# Patient Record
Sex: Female | Born: 1945
Health system: Southern US, Community
[De-identification: ages and names within clinical notes are randomized; demographics above are authoritative.]

## PROBLEM LIST (undated history)

## (undated) DIAGNOSIS — C801 Malignant (primary) neoplasm, unspecified: Secondary | ICD-10-CM

## (undated) DIAGNOSIS — T7840XA Allergy, unspecified, initial encounter: Secondary | ICD-10-CM

## (undated) DIAGNOSIS — H269 Unspecified cataract: Secondary | ICD-10-CM

## (undated) DIAGNOSIS — D126 Benign neoplasm of colon, unspecified: Secondary | ICD-10-CM

## (undated) DIAGNOSIS — I10 Essential (primary) hypertension: Secondary | ICD-10-CM

## (undated) DIAGNOSIS — J302 Other seasonal allergic rhinitis: Secondary | ICD-10-CM

## (undated) DIAGNOSIS — G4730 Sleep apnea, unspecified: Secondary | ICD-10-CM

## (undated) DIAGNOSIS — M1990 Unspecified osteoarthritis, unspecified site: Secondary | ICD-10-CM

## (undated) DIAGNOSIS — E785 Hyperlipidemia, unspecified: Secondary | ICD-10-CM

## (undated) DIAGNOSIS — M8580 Other specified disorders of bone density and structure, unspecified site: Secondary | ICD-10-CM

## (undated) HISTORY — PX: COLONOSCOPY: SHX174

## (undated) HISTORY — DX: Sleep apnea, unspecified: G47.30

## (undated) HISTORY — DX: Unspecified cataract: H26.9

## (undated) HISTORY — DX: Hyperlipidemia, unspecified: E78.5

## (undated) HISTORY — PX: DILATION AND CURETTAGE OF UTERUS: SHX78

## (undated) HISTORY — DX: Malignant (primary) neoplasm, unspecified: C80.1

## (undated) HISTORY — PX: KNEE ARTHROSCOPY: SUR90

## (undated) HISTORY — DX: Other specified disorders of bone density and structure, unspecified site: M85.80

## (undated) HISTORY — DX: Allergy, unspecified, initial encounter: T78.40XA

## (undated) HISTORY — DX: Other seasonal allergic rhinitis: J30.2

## (undated) HISTORY — DX: Unspecified osteoarthritis, unspecified site: M19.90

## (undated) HISTORY — DX: Essential (primary) hypertension: I10

## (undated) HISTORY — DX: Benign neoplasm of colon, unspecified: D12.6

---
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
## 1979-07-22 HISTORY — PX: CHOLECYSTECTOMY: SHX55

## 1992-07-21 HISTORY — PX: VAGINAL HYSTERECTOMY: SUR661

## 1995-07-22 DIAGNOSIS — C801 Malignant (primary) neoplasm, unspecified: Secondary | ICD-10-CM

## 1995-07-22 HISTORY — DX: Malignant (primary) neoplasm, unspecified: C80.1

## 2004-05-17 ENCOUNTER — Emergency Department (HOSPITAL_COMMUNITY): Admission: EM | Admit: 2004-05-17 | Discharge: 2004-05-17 | Payer: Self-pay | Admitting: Family Medicine

## 2006-10-20 ENCOUNTER — Ambulatory Visit: Payer: Self-pay | Admitting: Internal Medicine

## 2006-11-09 ENCOUNTER — Ambulatory Visit: Payer: Self-pay | Admitting: Internal Medicine

## 2006-11-09 ENCOUNTER — Encounter (INDEPENDENT_AMBULATORY_CARE_PROVIDER_SITE_OTHER): Payer: Self-pay | Admitting: Specialist

## 2006-12-07 ENCOUNTER — Ambulatory Visit: Payer: Self-pay | Admitting: Internal Medicine

## 2007-07-22 HISTORY — PX: FOOT SURGERY: SHX648

## 2007-11-14 ENCOUNTER — Ambulatory Visit: Payer: Self-pay | Admitting: Vascular Surgery

## 2007-11-14 ENCOUNTER — Encounter (INDEPENDENT_AMBULATORY_CARE_PROVIDER_SITE_OTHER): Payer: Self-pay | Admitting: Emergency Medicine

## 2007-11-14 ENCOUNTER — Emergency Department (HOSPITAL_COMMUNITY): Admission: EM | Admit: 2007-11-14 | Discharge: 2007-11-14 | Payer: Self-pay | Admitting: Family Medicine

## 2008-01-19 HISTORY — PX: OTHER SURGICAL HISTORY: SHX169

## 2009-01-01 ENCOUNTER — Telehealth: Payer: Self-pay | Admitting: Internal Medicine

## 2009-01-02 DIAGNOSIS — K7689 Other specified diseases of liver: Secondary | ICD-10-CM | POA: Insufficient documentation

## 2009-01-02 DIAGNOSIS — Z860100 Personal history of colon polyps, unspecified: Secondary | ICD-10-CM | POA: Insufficient documentation

## 2009-01-02 DIAGNOSIS — K648 Other hemorrhoids: Secondary | ICD-10-CM | POA: Insufficient documentation

## 2009-01-02 DIAGNOSIS — Z85828 Personal history of other malignant neoplasm of skin: Secondary | ICD-10-CM

## 2009-01-02 DIAGNOSIS — K5730 Diverticulosis of large intestine without perforation or abscess without bleeding: Secondary | ICD-10-CM | POA: Insufficient documentation

## 2009-01-02 DIAGNOSIS — M810 Age-related osteoporosis without current pathological fracture: Secondary | ICD-10-CM | POA: Insufficient documentation

## 2009-01-02 DIAGNOSIS — N809 Endometriosis, unspecified: Secondary | ICD-10-CM | POA: Insufficient documentation

## 2009-01-02 DIAGNOSIS — G5600 Carpal tunnel syndrome, unspecified upper limb: Secondary | ICD-10-CM

## 2009-01-02 DIAGNOSIS — Z8719 Personal history of other diseases of the digestive system: Secondary | ICD-10-CM

## 2009-01-10 ENCOUNTER — Ambulatory Visit: Payer: Self-pay | Admitting: Internal Medicine

## 2009-10-31 ENCOUNTER — Encounter (INDEPENDENT_AMBULATORY_CARE_PROVIDER_SITE_OTHER): Payer: Self-pay | Admitting: *Deleted

## 2010-08-20 NOTE — Letter (Signed)
Summary: Colonoscopy Letter  [facility]  [address] [place], [place] [address]   Phone: [phone]  Fax: [phone]      October 31, 2009 MRN: [id number]   Diane Macias [address] [place], [place]  [address]   Dear [name],   According to your medical record, it is time for you to schedule a Colonoscopy. The American Cancer Society recommends this procedure as a method to detect early colon cancer. Patients with a family history of colon cancer, or a personal history of colon polyps or inflammatory bowel disease are at increased risk.  This letter has beeen generated based on the recommendations made at the time of your procedure. If you feel that in your particular situation this may no longer apply, please contact our office.  Please call our office at [phone] to schedule this appointment or to update your records at your earliest convenience.  Thank you for cooperating with [place] to provide you with the very best care possible.   Sincerely,  Hedwig Morton. Juanda Chance, M.D.  [facility] [phone]

## 2010-11-19 HISTORY — PX: OTHER SURGICAL HISTORY: SHX169

## 2010-12-03 NOTE — Assessment & Plan Note (Signed)
[facility]                         GASTROENTEROLOGY OFFICE NOTE   NAME:Diane Macias, Diane Macias                        MRN:          [id number]  DATE:12/07/2006                            DOB:          August 08, 1945    Diane Macias is a 65 year old white female patient of Dr. Derrell Lolling who had  undergone recent colonoscopy for evaluation of recurrent attack of  diverticulitis with findings of an adenomatous polyp of the colon in the  cecum.  She also had a moderately severe diverticulosis of the left  colon. There was mild narrowing and tortuosity of the left colon.  Left  episode of diverticulitis occurred in February 2008, and required  antibiotics.  Diane Macias has a history of adenomatous polyp of the colon  on first colonoscopy in December 1999, and again in March 2005.  She is  doing well.  Now being asymptomatic using Benefiber 2 teaspoons daily  and moderate fiber diet.  She is unable to tolerate many different foods  and I advised her to avoid the foods that she is intolerant to.   PHYSICAL EXAMINATION:  GENERAL APPEARANCE:  Patient was alert and  oriented, in no distress.  VITAL SIGNS:  Blood pressure 132/80, pulse 72, weight 222 pounds.  LUNGS:  Clear to auscultation.  CARDIOVASCULAR:  Normal S1 and normal S2.  ABDOMEN:  Soft, obese with mild tenderness in the left lower quadrant  and left mid quadrant.  There was no rebound and no palpable mass.  The  exam was limited because of the large size of her abdomen.  Right lower  and middle quadrants were normal.  RECTAL:  Exam not repeated.   IMPRESSION:  A 65 year old white female with symptomatic diverticulosis,  status post three discrete episodes of diverticulitis in the past 10  years, last one three months ago.  She is currently asymptomatic on high  fiber diet.   PLAN:  We have discussed possibility of sigmoid resection should she  have repeated attacks of diverticulitis but she prefers to wait and  stay  on the Benefiber 2 teaspoons daily.  I have given her a prescription for  Cipro 500 mg to take one twice a day at the first onset of the attack.  She is to follow up with Dr. Derrell Lolling.  As far as her adenomatous polyps of  the colon are concerned, we recommend recall interval as five years but  patient feels that perhaps three years would be more appropriate since  she had polyps on every colonoscopy so far.  We will make that decision  in three years. I would like to see her back once a year.     Hedwig Morton. Juanda Chance, MD  Electronically Signed    DMB/MedQ  DD: 12/07/2006  DT: 12/07/2006  Job #: [id number]   cc:   Synetta Fail, M.D.

## 2011-02-05 ENCOUNTER — Telehealth: Payer: Self-pay | Admitting: *Deleted

## 2011-02-05 NOTE — Telephone Encounter (Signed)
Left message on patient's voicemail for patient to call back. She is overdue for colonoscopy to follow up on adenomatous colon polyps found in 2008. Per Paper Chart, recall was actually set for 5 year intervals, but patient requested 3 year interval.

## 2011-02-10 NOTE — Telephone Encounter (Signed)
-----   Message -----    From: Marylynn Pearson    Sent: 02/10/2011   4:14 PM      To: Vernia Buff, CMA  Patient scheduled her recall col......Marland Kitchen

## 2011-04-04 ENCOUNTER — Ambulatory Visit (AMBULATORY_SURGERY_CENTER): Payer: 59 | Admitting: *Deleted

## 2011-04-04 ENCOUNTER — Encounter: Payer: Self-pay | Admitting: Internal Medicine

## 2011-04-04 VITALS — Ht 63.0 in | Wt 215.0 lb

## 2011-04-04 DIAGNOSIS — Z1211 Encounter for screening for malignant neoplasm of colon: Secondary | ICD-10-CM

## 2011-04-04 MED ORDER — SUPREP BOWEL PREP KIT 17.5-3.13-1.6 GM/177ML PO SOLN: 1.0000 | ORAL | Status: DC

## 2011-04-18 ENCOUNTER — Encounter: Payer: Self-pay | Admitting: Internal Medicine

## 2011-04-18 ENCOUNTER — Ambulatory Visit (AMBULATORY_SURGERY_CENTER): Payer: 59 | Admitting: Internal Medicine

## 2011-04-18 VITALS — BP 111/58 | HR 63 | Temp 97.1°F | Resp 18 | Ht 63.0 in | Wt 215.0 lb

## 2011-04-18 DIAGNOSIS — D126 Benign neoplasm of colon, unspecified: Secondary | ICD-10-CM

## 2011-04-18 DIAGNOSIS — Z1211 Encounter for screening for malignant neoplasm of colon: Secondary | ICD-10-CM

## 2011-04-18 HISTORY — DX: Benign neoplasm of colon, unspecified: D12.6

## 2011-04-18 LAB — GLUCOSE, CAPILLARY
Glucose-Capillary: 114 mg/dL — ABNORMAL HIGH (ref 70–99)
Glucose-Capillary: 96 mg/dL (ref 70–99)

## 2011-04-18 MED ORDER — SODIUM CHLORIDE 0.9 % IV SOLN: 500.0000 mL | INTRAVENOUS | Status: DC

## 2011-04-18 NOTE — Patient Instructions (Signed)
Follow your discharge instructions.  Continue your medications.  High Fiber Diet with liberal fluid intake.  Await pathology results.   

## 2011-04-21 ENCOUNTER — Telehealth: Payer: Self-pay

## 2011-04-21 NOTE — Telephone Encounter (Signed)
Follow up Call- Patient questions:  Do you have a fever, pain , or abdominal swelling? no Pain Score  0 *  Have you tolerated food without any problems? yes  Have you been able to return to your normal activities? yes  Do you have any questions about your discharge instructions: Diet   no Medications  no Follow up visit  no  Do you have questions or concerns about your Care? no  Actions: * If pain score is 4 or above: No action needed, pain <4.  Per the pt she liked the suprep much better. It had less stomach gripping. maw

## 2011-04-22 ENCOUNTER — Encounter: Payer: Self-pay | Admitting: Internal Medicine

## 2014-03-09 DIAGNOSIS — N3946 Mixed incontinence: Secondary | ICD-10-CM | POA: Insufficient documentation

## 2014-03-09 DIAGNOSIS — N390 Urinary tract infection, site not specified: Secondary | ICD-10-CM | POA: Insufficient documentation

## 2014-07-21 HISTORY — PX: CATARACT EXTRACTION: SUR2

## 2014-11-30 DIAGNOSIS — M7581 Other shoulder lesions, right shoulder: Secondary | ICD-10-CM | POA: Insufficient documentation

## 2014-11-30 DIAGNOSIS — M7541 Impingement syndrome of right shoulder: Secondary | ICD-10-CM | POA: Insufficient documentation

## 2014-12-09 DIAGNOSIS — M75111 Incomplete rotator cuff tear or rupture of right shoulder, not specified as traumatic: Secondary | ICD-10-CM | POA: Insufficient documentation

## 2015-03-24 ENCOUNTER — Emergency Department (HOSPITAL_BASED_OUTPATIENT_CLINIC_OR_DEPARTMENT_OTHER)
Admission: EM | Admit: 2015-03-24 | Discharge: 2015-03-24 | Disposition: A | Discharge status: Home / Self Care | Payer: PPO | Attending: Emergency Medicine | Admitting: Emergency Medicine

## 2015-03-24 ENCOUNTER — Encounter (HOSPITAL_BASED_OUTPATIENT_CLINIC_OR_DEPARTMENT_OTHER): Payer: Self-pay | Admitting: *Deleted

## 2015-03-24 DIAGNOSIS — Z79899 Other long term (current) drug therapy: Secondary | ICD-10-CM | POA: Insufficient documentation

## 2015-03-24 DIAGNOSIS — T7840XA Allergy, unspecified, initial encounter: Secondary | ICD-10-CM

## 2015-03-24 DIAGNOSIS — R21 Rash and other nonspecific skin eruption: Secondary | ICD-10-CM | POA: Insufficient documentation

## 2015-03-24 DIAGNOSIS — Z7982 Long term (current) use of aspirin: Secondary | ICD-10-CM | POA: Insufficient documentation

## 2015-03-24 DIAGNOSIS — Z86010 Personal history of colonic polyps: Secondary | ICD-10-CM | POA: Insufficient documentation

## 2015-03-24 DIAGNOSIS — M8580 Other specified disorders of bone density and structure, unspecified site: Secondary | ICD-10-CM | POA: Diagnosis not present

## 2015-03-24 DIAGNOSIS — Z87891 Personal history of nicotine dependence: Secondary | ICD-10-CM | POA: Insufficient documentation

## 2015-03-24 DIAGNOSIS — E785 Hyperlipidemia, unspecified: Secondary | ICD-10-CM | POA: Insufficient documentation

## 2015-03-24 DIAGNOSIS — T368X5A Adverse effect of other systemic antibiotics, initial encounter: Secondary | ICD-10-CM | POA: Diagnosis not present

## 2015-03-24 DIAGNOSIS — E119 Type 2 diabetes mellitus without complications: Secondary | ICD-10-CM | POA: Insufficient documentation

## 2015-03-24 DIAGNOSIS — I10 Essential (primary) hypertension: Secondary | ICD-10-CM | POA: Insufficient documentation

## 2015-03-24 DIAGNOSIS — M1990 Unspecified osteoarthritis, unspecified site: Secondary | ICD-10-CM | POA: Insufficient documentation

## 2015-03-24 DIAGNOSIS — R109 Unspecified abdominal pain: Secondary | ICD-10-CM | POA: Diagnosis not present

## 2015-03-24 DIAGNOSIS — Z85828 Personal history of other malignant neoplasm of skin: Secondary | ICD-10-CM | POA: Diagnosis not present

## 2015-03-24 MED ORDER — PREDNISONE 20 MG PO TABS
40.0000 mg | ORAL_TABLET | Freq: Once | ORAL | Status: AC
  Administered 2015-03-24: 40 mg via ORAL
  Filled 2015-03-24: qty 2

## 2015-03-24 MED ORDER — AMOXICILLIN-POT CLAVULANATE 875-125 MG PO TABS
1.0000 | ORAL_TABLET | Freq: Once | ORAL | Status: AC
  Administered 2015-03-24: 1 via ORAL
  Filled 2015-03-24: qty 1

## 2015-03-24 MED ORDER — PREDNISONE 20 MG PO TABS: 40.0000 mg | ORAL_TABLET | Freq: Every day | ORAL | Status: DC

## 2015-03-24 MED ORDER — AMOXICILLIN-POT CLAVULANATE 875-125 MG PO TABS: 1.0000 | ORAL_TABLET | Freq: Two times a day (BID) | ORAL | Status: DC

## 2015-03-24 NOTE — Discharge Instructions (Signed)

## 2015-03-24 NOTE — ED Provider Notes (Signed)
CSN: [id number]     Arrival date & time 03/24/15  1819 History  This chart was scribed for Noemi Chapel, MD by Hansel Feinstein, ED Scribe. This patient was seen in room MH01/MH01 and the patient's care was started at 6:49 PM.      Chief Complaint  Patient presents with  . Rash   The history is provided by the patient. No language interpreter was used.    HPI Comments: Diane Macias is a 69 y.o. female who presents to the Emergency Department complaining of moderate, non-pruritic rash to the BUE, back, neck onset today with associated abdominal discomfort. Pt states that she was started on Cipro for diverticulitis yesterday and has had the rash since. Pt reports that she has taken Cipro in the past. She notes a similar reaction to a medication last year with SOB and dizziness, but no rash. Pt is allergic to sulfa drugs. She denies SOB, dizziness with this episode.   Past Medical History  Diagnosis Date  . Allergy   . Arthritis   . Diabetes mellitus     type II  . Cancer 1997    squamous and basal cell  . Hyperlipidemia   . Hypertension   . Osteopenia   . Adenomatous colon polyp    Past Surgical History  Procedure Laterality Date  . Vaginal hysterectomy  1994  . Cholecystectomy  1981  . Tonsillectomy  1953  . Colonoscopy    . Torn maniscus  11/2010    left knee  . Arthroscopic knee  01/2008    right knee  . Foot surgery  07/2007    right foot ([facility])   Family History  Problem Relation Age of Onset  . Lung cancer Father 40   Social History  Substance Use Topics  . Smoking status: Former Smoker    Quit date: 07/21/1968  . Smokeless tobacco: Never Used  . Alcohol Use: Yes     Comment: rarely wine   OB History    No data available     Review of Systems  Respiratory: Negative for shortness of breath.   Gastrointestinal: Positive for abdominal pain.  Skin: Positive for rash.  Neurological: Negative for dizziness.    Allergies  Celecoxib; Diazepam; Flagyl; Hydrocodone;  and Sulfonamide derivatives  Home Medications   Prior to Admission medications   Medication Sig Start Date End Date Taking? Authorizing Provider  amoxicillin-clavulanate (AUGMENTIN) 875-125 MG per tablet Take 1 tablet by mouth every 12 (twelve) hours. 03/24/15   Noemi Chapel, MD  aspirin 81 MG tablet Take 81 mg by mouth 2 (two) times a week.      Historical Provider, MD  B Complex Vitamins (VITAMIN B COMPLEX PO) Take 1 tablet by mouth daily.      Historical Provider, MD  cholecalciferol (VITAMIN D) 1000 UNITS tablet Take 1,000 Units by mouth daily.      Historical Provider, MD  lisinopril (PRINIVIL,ZESTRIL) 10 MG tablet Take 10 mg by mouth daily.      Historical Provider, MD  metFORMIN (GLUCOPHAGE) 500 MG tablet Take 500 mg by mouth daily after supper.      Historical Provider, MD  pravastatin (PRAVACHOL) 10 MG tablet Take 10 mg by mouth daily.      Historical Provider, MD  predniSONE (DELTASONE) 20 MG tablet Take 2 tablets (40 mg total) by mouth daily. 03/24/15   Noemi Chapel, MD   BP 153/61 mmHg  Pulse 80  Temp(Src) 98.6 F (37 C) (Oral)  Resp 18  Ht 5\' 1"  (1.549 m)  Wt 200 lb (90.719 kg)  BMI 37.81 kg/m2  SpO2 96% Physical Exam  Constitutional: She appears well-developed and well-nourished. No distress.  HENT:  Head: Normocephalic and atraumatic.  Mouth/Throat: Oropharynx is clear and moist. No oropharyngeal exudate.  OP clear, normal voice, no distress / swelling  Eyes: Conjunctivae and EOM are normal. Pupils are equal, round, and reactive to light. Right eye exhibits no discharge. Left eye exhibits no discharge. No scleral icterus.  Neck: Normal range of motion. Neck supple. No JVD present. No thyromegaly present.  Cardiovascular: Normal rate, regular rhythm, normal heart sounds and intact distal pulses.  Exam reveals no gallop and no friction rub.   No murmur heard. Pulmonary/Chest: Effort normal and breath sounds normal. No respiratory distress. She has no wheezes. She has no  rales.  Abdominal: Soft. Bowel sounds are normal. She exhibits no distension and no mass. There is no tenderness.  Musculoskeletal: Normal range of motion. She exhibits no edema or tenderness.  Lymphadenopathy:    She has no cervical adenopathy.  Neurological: She is alert. Coordination normal.  Skin: Skin is warm and dry. Rash noted. No erythema.  Blanching erythematous rash covering the back and most of the upper extremities.   Psychiatric: She has a normal mood and affect. Her behavior is normal.  Nursing note and vitals reviewed.   ED Course  Procedures (including critical care time) DIAGNOSTIC STUDIES: Oxygen Saturation is 96% on RA, normal by my interpretation.    COORDINATION OF CARE: 6:54 PM Discussed treatment plan with pt at bedside and pt agreed to plan.   Labs Review Labs Reviewed - No data to display  Imaging Review No results found. I have personally reviewed and evaluated these images and lab results as part of my medical decision-making.    MDM   Final diagnoses:  Allergic reaction to drug    Allergy to Flagyl - stop immediately Augmentin - stop cipro and flagyl Prednisone for 5 days Recheck on Tuesady after holliday No airway involvement  Meds given in ED:  Medications  amoxicillin-clavulanate (AUGMENTIN) 875-125 MG per tablet 1 tablet (not administered)  predniSONE (DELTASONE) tablet 40 mg (not administered)    New Prescriptions   AMOXICILLIN-CLAVULANATE (AUGMENTIN) 875-125 MG PER TABLET    Take 1 tablet by mouth every 12 (twelve) hours.   PREDNISONE (DELTASONE) 20 MG TABLET    Take 2 tablets (40 mg total) by mouth daily.   I personally performed the services described in this documentation, which was scribed in my presence. The recorded information has been reviewed and is accurate.      Noemi Chapel, MD 03/24/15 1901

## 2015-03-24 NOTE — ED Notes (Signed)
Three Rx received: 1. Metronidazole 500mg  PO 2. Tramadol (Ultracet) 3. Ciprofloxacin 500mg  PO (all meds returned back to patient)

## 2015-03-24 NOTE — ED Notes (Signed)
States went to an Urgent Care rec medication for diverticulitis and today, this afternoon, noted to have redness all over body. No fever, no N/V, but has poor appetite. Breathing WNL

## 2015-03-24 NOTE — ED Notes (Signed)
Pt states she has a rash x 1 day and is taking flagyl for diverticulitis , pt states i am allergic to flagyl.

## 2015-03-27 ENCOUNTER — Telehealth: Payer: Self-pay | Admitting: Internal Medicine

## 2015-03-27 NOTE — Telephone Encounter (Signed)
Patient is asking for OV to establish with a new GI. States she had been treated for diverticulitis by her PCP. Scheduled with Dr. Silverio Decamp in November.

## 2015-04-26 ENCOUNTER — Other Ambulatory Visit: Payer: Self-pay

## 2015-04-26 ENCOUNTER — Telehealth: Payer: Self-pay | Admitting: Internal Medicine

## 2015-04-26 NOTE — Telephone Encounter (Signed)
LLQ pain "in the spot where my diverticulitis usually starts". 2 to 3 days of symptoms which are worsening. She has some indigestion. She agrees to contact her PCP with her symptoms. Hopefully he will start treatment. Appointment first available here 04/30/15 with an extender. Scheduled for 2:00 pm.  She will call me back after to talking to her PCP.

## 2015-04-26 NOTE — Telephone Encounter (Signed)
(  schedule as a new patient) I have left message for the patient to call back

## 2015-04-27 ENCOUNTER — Encounter: Payer: Self-pay | Admitting: *Deleted

## 2015-04-30 ENCOUNTER — Ambulatory Visit (INDEPENDENT_AMBULATORY_CARE_PROVIDER_SITE_OTHER): Payer: PPO | Admitting: Physician Assistant

## 2015-04-30 ENCOUNTER — Encounter: Payer: Self-pay | Admitting: Physician Assistant

## 2015-04-30 VITALS — BP 132/76 | HR 64 | Ht 63.0 in | Wt 204.0 lb

## 2015-04-30 DIAGNOSIS — K5732 Diverticulitis of large intestine without perforation or abscess without bleeding: Secondary | ICD-10-CM | POA: Diagnosis not present

## 2015-04-30 NOTE — Patient Instructions (Addendum)
Please purchase the following medications over the counter and take as directed: Take Benafiber daily and Miralax as needed Probiotic daily  Please finish taking Augmentin-please call our office once you have finished taking your antibiotic if the symptoms did not resolve.

## 2015-04-30 NOTE — Progress Notes (Signed)
Patient ID: Diane Macias, female   DOB: 1946-06-17, 69 y.o.   MRN: [id number]   Subjective:    Patient ID: Diane Macias, female    DOB: [birth year], 69 y.o.   MRN: [id number]  HPI  Diane Macias is a pleasant 69 year old white female former patient of Dr. Sydell Axon Brodie's who comes in today with complaints of recurrent diverticulitis. She was last seen in our office in September 2012 at which time she had colonoscopy. He did have moderate diverticulosis in the sigmoid colon and one diminutive polyp was removed. Path was consistent with a tubular adenoma. Other medical problems include adult-onset diabetes mellitus and she is undergoing evaluation for sleep apnea. She says that she is frustrated because she has had multiple episodes of diverticulitis over the past year or so. She had an episode in January, another in the spring both treated by her primary care physician. She then had a third episode over the Labor Day weekend. She was initially seen at an urgent care and then placed on Cipro and Flagyl. She reacted  to one of the antibiotics with a diffuse rash and had to be seen in the emergency room. Cipro and Flagyl were stopped and she was treated with a seven-day course of Augmentin. She says her symptoms did resolve after each of these episodes After this last episode ,just a few weeks later she started noticing left lower quadrant pain again last week. Again she was seen by her primary care physician started on Augmentin on 04/26/2015. She is feeling better and her pain is subsiding. She says she still has mild residual tenderness. She's not clear what is triggering these episodes and wants to know what she can do to avoid recurrent diverticulitis. She specifically does not want to have surgery. He says sometimes taking antibiotics for bladder infections tends to make her constipated and constipation seems to trigger these episodes. She is taking a probiotic daily and has been trying to take Metamucil  daily.  Review of Systems Pertinent positive and negative review of systems were noted in the above HPI section.  All other review of systems was otherwise negative.  Outpatient Encounter Prescriptions as of 04/30/2015  Medication Sig  . amoxicillin-clavulanate (AUGMENTIN) 875-125 MG per tablet Take 1 tablet by mouth every 12 (twelve) hours.  Marland Kitchen aspirin 81 MG tablet Take 81 mg by mouth 2 (two) times a week.    . B Complex Vitamins (VITAMIN B COMPLEX PO) Take 1 tablet by mouth daily.    . cholecalciferol (VITAMIN D) 1000 UNITS tablet Take 1,000 Units by mouth daily.    Marland Kitchen glucose blood test strip 1 each by Other route as needed for other. Use as instructed  . lisinopril (PRINIVIL,ZESTRIL) 10 MG tablet Take 10 mg by mouth daily.    . metFORMIN (GLUCOPHAGE) 500 MG tablet Take 500 mg by mouth daily after supper.    . ONE TOUCH LANCETS MISC by Does not apply route.  . pravastatin (PRAVACHOL) 10 MG tablet Take 10 mg by mouth daily.    . vitamin B-12 (CYANOCOBALAMIN) 1000 MCG tablet Take 1,000 mcg by mouth daily.  . [DISCONTINUED] predniSONE (DELTASONE) 20 MG tablet Take 2 tablets (40 mg total) by mouth daily. (Patient not taking: Reported on 04/30/2015)   No facility-administered encounter medications on file as of 04/30/2015.   Allergies  Allergen Reactions  . Celecoxib Swelling  . Diazepam Other (See Comments)    dpression  . Flagyl [Metronidazole]   . Hydrocodone Other (See Comments)  Could not sleep  . Sulfonamide Derivatives Rash   Patient Active Problem List   Diagnosis Date Noted  . CARPAL TUNNEL SYNDROME 01/02/2009  . INTERNAL HEMORRHOIDS 01/02/2009  . DIVERTICULOSIS, COLON 01/02/2009  . FATTY LIVER DISEASE 01/02/2009  . ENDOMETRIOSIS 01/02/2009  . OSTEOPOROSIS 01/02/2009  . SKIN CANCER, HX OF 01/02/2009  . COLONIC POLYPS, ADENOMATOUS, HX OF 01/02/2009  . DIVERTICULITIS, HX OF 01/02/2009   Social History   Social History  . Marital Status: Married    Spouse Name: N/A   . Number of Children: N/A  . Years of Education: N/A   Occupational History  . Not on file.   Social History Main Topics  . Smoking status: Former Smoker    Quit date: 07/21/1968  . Smokeless tobacco: Never Used  . Alcohol Use: Yes     Comment: rarely wine  . Drug Use: No  . Sexual Activity: Yes    Birth Control/ Protection: None   Other Topics Concern  . Not on file   Social History Narrative    Diane Macias's family history includes Lung cancer (age of onset: 21) in her father.      Objective:    Filed Vitals:   04/30/15 1416  BP: 132/76  Pulse: 64    Physical Exam  well-developed older white female in no acute distress, pleasant blood pressure 132/76 pulse 64 height 5 foot 3 weight 204. HEENT; nontraumatic normocephalic EOMI PERRLA sclera anicteric, Supple; no JVD, Cardiovascular ;regular rate and rhythm with S1-S2 no murmur or gallop, Pulmonary ;clear bilaterally, Abdomen; soft, bowel sounds are present she is mildly tender in the left lower quadrant there is no guarding or rebound no palpable mass or hepatosplenomegaly, Rectal; exam not done, Extremities; no clubbing cyanosis or edema skin warm and dry, Neuropsych; mood and affect appropriate       Assessment & Plan:   #1 69 yo female with recurrent diverticulitis- 4 episodes in the past year- currently on  Augmentin 875 BID for this last episode and improving. #2 hx of adenomatous polyps- last Colon 03/2011  Plan; Pt will complete a  14 days course  Of Augmentin 875 BID- suspect she may not have been  Treated long enough with her last episode. She will call if sxs have not completely resolved when she finishes antibiotic-would give an additional week  Daily probiotic as doing  Add Benefiber daily, liberal water intake 6-8 glasses per day, and keep Miralax on hand for Prn use for any mild constipation- hopefully this will reduce recurrences. Discussed surgical referral but she wants to avoid  Will be established  with Dr Silverio Decamp Follow up Colonoscopy  around 03/2016.   Amy Genia Harold PA-C 04/30/2015   Cc: Lilian Coma., MD

## 2015-05-01 NOTE — Progress Notes (Signed)
Reviewed and agree with management plan. We should also consider scheduling for colonoscopy sooner for evaluation given recurrent episodes of diverticulitis, can schedule after resolution of this acute episode K. Denzil Magnuson , MD [phone] Mon-Fri 8a-5p [phone] after 5p, weekends, holidays

## 2015-05-25 ENCOUNTER — Ambulatory Visit: Payer: PPO | Admitting: Gastroenterology

## 2015-07-24 DIAGNOSIS — M545 Low back pain: Secondary | ICD-10-CM | POA: Diagnosis not present

## 2015-07-24 DIAGNOSIS — M79604 Pain in right leg: Secondary | ICD-10-CM | POA: Diagnosis not present

## 2015-07-25 DIAGNOSIS — G4733 Obstructive sleep apnea (adult) (pediatric): Secondary | ICD-10-CM | POA: Diagnosis not present

## 2015-07-26 DIAGNOSIS — M79604 Pain in right leg: Secondary | ICD-10-CM | POA: Diagnosis not present

## 2015-07-26 DIAGNOSIS — M545 Low back pain: Secondary | ICD-10-CM | POA: Diagnosis not present

## 2015-07-26 DIAGNOSIS — G4733 Obstructive sleep apnea (adult) (pediatric): Secondary | ICD-10-CM | POA: Diagnosis not present

## 2015-08-02 DIAGNOSIS — M79604 Pain in right leg: Secondary | ICD-10-CM | POA: Diagnosis not present

## 2015-08-02 DIAGNOSIS — M545 Low back pain: Secondary | ICD-10-CM | POA: Diagnosis not present

## 2015-08-07 DIAGNOSIS — M545 Low back pain: Secondary | ICD-10-CM | POA: Diagnosis not present

## 2015-08-07 DIAGNOSIS — M79604 Pain in right leg: Secondary | ICD-10-CM | POA: Diagnosis not present

## 2015-08-09 DIAGNOSIS — M545 Low back pain: Secondary | ICD-10-CM | POA: Diagnosis not present

## 2015-08-09 DIAGNOSIS — M79604 Pain in right leg: Secondary | ICD-10-CM | POA: Diagnosis not present

## 2015-08-14 DIAGNOSIS — M79604 Pain in right leg: Secondary | ICD-10-CM | POA: Diagnosis not present

## 2015-08-14 DIAGNOSIS — M545 Low back pain: Secondary | ICD-10-CM | POA: Diagnosis not present

## 2015-08-16 DIAGNOSIS — M79604 Pain in right leg: Secondary | ICD-10-CM | POA: Diagnosis not present

## 2015-08-16 DIAGNOSIS — M545 Low back pain: Secondary | ICD-10-CM | POA: Diagnosis not present

## 2015-08-20 DIAGNOSIS — Z9989 Dependence on other enabling machines and devices: Secondary | ICD-10-CM | POA: Diagnosis not present

## 2015-08-20 DIAGNOSIS — G4733 Obstructive sleep apnea (adult) (pediatric): Secondary | ICD-10-CM | POA: Diagnosis not present

## 2015-08-23 DIAGNOSIS — M545 Low back pain: Secondary | ICD-10-CM | POA: Diagnosis not present

## 2015-08-23 DIAGNOSIS — M79604 Pain in right leg: Secondary | ICD-10-CM | POA: Diagnosis not present

## 2015-08-26 DIAGNOSIS — G4733 Obstructive sleep apnea (adult) (pediatric): Secondary | ICD-10-CM | POA: Diagnosis not present

## 2015-09-03 DIAGNOSIS — H9201 Otalgia, right ear: Secondary | ICD-10-CM | POA: Insufficient documentation

## 2015-09-03 DIAGNOSIS — H903 Sensorineural hearing loss, bilateral: Secondary | ICD-10-CM | POA: Diagnosis not present

## 2015-09-03 DIAGNOSIS — H905 Unspecified sensorineural hearing loss: Secondary | ICD-10-CM | POA: Insufficient documentation

## 2015-09-04 DIAGNOSIS — M79604 Pain in right leg: Secondary | ICD-10-CM | POA: Diagnosis not present

## 2015-09-04 DIAGNOSIS — M545 Low back pain: Secondary | ICD-10-CM | POA: Diagnosis not present

## 2015-09-13 DIAGNOSIS — M545 Low back pain: Secondary | ICD-10-CM | POA: Diagnosis not present

## 2015-09-13 DIAGNOSIS — M79604 Pain in right leg: Secondary | ICD-10-CM | POA: Diagnosis not present

## 2015-09-17 DIAGNOSIS — H2511 Age-related nuclear cataract, right eye: Secondary | ICD-10-CM | POA: Diagnosis not present

## 2015-09-17 DIAGNOSIS — H16223 Keratoconjunctivitis sicca, not specified as Sjogren's, bilateral: Secondary | ICD-10-CM | POA: Diagnosis not present

## 2015-09-17 DIAGNOSIS — Z83511 Family history of glaucoma: Secondary | ICD-10-CM | POA: Diagnosis not present

## 2015-09-17 DIAGNOSIS — H5213 Myopia, bilateral: Secondary | ICD-10-CM | POA: Diagnosis not present

## 2015-09-21 DIAGNOSIS — M8580 Other specified disorders of bone density and structure, unspecified site: Secondary | ICD-10-CM | POA: Diagnosis not present

## 2015-09-21 DIAGNOSIS — G4733 Obstructive sleep apnea (adult) (pediatric): Secondary | ICD-10-CM | POA: Diagnosis not present

## 2015-09-21 DIAGNOSIS — I10 Essential (primary) hypertension: Secondary | ICD-10-CM | POA: Diagnosis not present

## 2015-09-21 DIAGNOSIS — E119 Type 2 diabetes mellitus without complications: Secondary | ICD-10-CM | POA: Diagnosis not present

## 2015-09-23 DIAGNOSIS — G4733 Obstructive sleep apnea (adult) (pediatric): Secondary | ICD-10-CM | POA: Diagnosis not present

## 2015-09-25 DIAGNOSIS — M545 Low back pain: Secondary | ICD-10-CM | POA: Diagnosis not present

## 2015-09-25 DIAGNOSIS — M79604 Pain in right leg: Secondary | ICD-10-CM | POA: Diagnosis not present

## 2015-10-09 DIAGNOSIS — M79604 Pain in right leg: Secondary | ICD-10-CM | POA: Diagnosis not present

## 2015-10-09 DIAGNOSIS — M545 Low back pain: Secondary | ICD-10-CM | POA: Diagnosis not present

## 2015-10-24 DIAGNOSIS — G4733 Obstructive sleep apnea (adult) (pediatric): Secondary | ICD-10-CM | POA: Diagnosis not present

## 2015-11-01 DIAGNOSIS — R05 Cough: Secondary | ICD-10-CM | POA: Diagnosis not present

## 2015-11-23 DIAGNOSIS — G4733 Obstructive sleep apnea (adult) (pediatric): Secondary | ICD-10-CM | POA: Diagnosis not present

## 2015-11-27 DIAGNOSIS — M8589 Other specified disorders of bone density and structure, multiple sites: Secondary | ICD-10-CM | POA: Diagnosis not present

## 2015-11-27 DIAGNOSIS — Z780 Asymptomatic menopausal state: Secondary | ICD-10-CM | POA: Diagnosis not present

## 2015-11-27 DIAGNOSIS — Z1231 Encounter for screening mammogram for malignant neoplasm of breast: Secondary | ICD-10-CM | POA: Diagnosis not present

## 2015-12-19 DIAGNOSIS — L821 Other seborrheic keratosis: Secondary | ICD-10-CM | POA: Diagnosis not present

## 2015-12-19 DIAGNOSIS — G4733 Obstructive sleep apnea (adult) (pediatric): Secondary | ICD-10-CM | POA: Diagnosis not present

## 2015-12-24 DIAGNOSIS — G4733 Obstructive sleep apnea (adult) (pediatric): Secondary | ICD-10-CM | POA: Diagnosis not present

## 2016-01-23 DIAGNOSIS — G4733 Obstructive sleep apnea (adult) (pediatric): Secondary | ICD-10-CM | POA: Diagnosis not present

## 2016-02-11 ENCOUNTER — Encounter: Payer: Self-pay | Admitting: Gastroenterology

## 2016-02-23 DIAGNOSIS — G4733 Obstructive sleep apnea (adult) (pediatric): Secondary | ICD-10-CM | POA: Diagnosis not present

## 2016-03-19 DIAGNOSIS — H02831 Dermatochalasis of right upper eyelid: Secondary | ICD-10-CM | POA: Diagnosis not present

## 2016-03-19 DIAGNOSIS — H16223 Keratoconjunctivitis sicca, not specified as Sjogren's, bilateral: Secondary | ICD-10-CM | POA: Diagnosis not present

## 2016-03-19 DIAGNOSIS — E119 Type 2 diabetes mellitus without complications: Secondary | ICD-10-CM | POA: Diagnosis not present

## 2016-03-19 DIAGNOSIS — H43392 Other vitreous opacities, left eye: Secondary | ICD-10-CM | POA: Diagnosis not present

## 2016-03-19 DIAGNOSIS — Z961 Presence of intraocular lens: Secondary | ICD-10-CM | POA: Diagnosis not present

## 2016-03-19 DIAGNOSIS — Z83511 Family history of glaucoma: Secondary | ICD-10-CM | POA: Diagnosis not present

## 2016-03-19 DIAGNOSIS — H2511 Age-related nuclear cataract, right eye: Secondary | ICD-10-CM | POA: Diagnosis not present

## 2016-03-19 DIAGNOSIS — H02402 Unspecified ptosis of left eyelid: Secondary | ICD-10-CM | POA: Diagnosis not present

## 2016-03-19 DIAGNOSIS — H5213 Myopia, bilateral: Secondary | ICD-10-CM | POA: Diagnosis not present

## 2016-03-20 ENCOUNTER — Telehealth: Payer: Self-pay | Admitting: Physician Assistant

## 2016-03-20 ENCOUNTER — Other Ambulatory Visit: Payer: Self-pay

## 2016-03-20 MED ORDER — AMOXICILLIN-POT CLAVULANATE 875-125 MG PO TABS: 1.0000 | ORAL_TABLET | Freq: Two times a day (BID) | ORAL | 0 refills | Status: AC

## 2016-03-20 NOTE — Telephone Encounter (Signed)
Yes - please call in Augmentin 875 mg po BID x 10 days- that is what she was treated with last time - if sxs worsen she needs to call and be seen , stay off  benefiber until sxs about resolved

## 2016-03-20 NOTE — Telephone Encounter (Signed)
History of diverticulitis. Last flare was October 2016. Patient reports the last several days she has had increased intestinal gas and a feeling of bloating. Past 2 days she has had LLQ sharp pain. Uncomfortable if pressure is applied. She uses Miralax, Financial controller. She has stopped the Benefiber for now. She states the pain and symptoms are exactly what she experiences when she developes diverticulitis. Can I call in ATB's for her?

## 2016-03-20 NOTE — Telephone Encounter (Signed)
Patient agrees to this plan of care.  

## 2016-03-25 DIAGNOSIS — G4733 Obstructive sleep apnea (adult) (pediatric): Secondary | ICD-10-CM | POA: Diagnosis not present

## 2016-03-26 DIAGNOSIS — Z0000 Encounter for general adult medical examination without abnormal findings: Secondary | ICD-10-CM | POA: Insufficient documentation

## 2016-03-26 DIAGNOSIS — H01001 Unspecified blepharitis right upper eyelid: Secondary | ICD-10-CM | POA: Diagnosis not present

## 2016-03-27 DIAGNOSIS — K760 Fatty (change of) liver, not elsewhere classified: Secondary | ICD-10-CM | POA: Diagnosis not present

## 2016-03-27 DIAGNOSIS — Z9989 Dependence on other enabling machines and devices: Secondary | ICD-10-CM | POA: Diagnosis not present

## 2016-03-27 DIAGNOSIS — E784 Other hyperlipidemia: Secondary | ICD-10-CM | POA: Diagnosis not present

## 2016-03-27 DIAGNOSIS — K5730 Diverticulosis of large intestine without perforation or abscess without bleeding: Secondary | ICD-10-CM | POA: Diagnosis not present

## 2016-03-27 DIAGNOSIS — Z0000 Encounter for general adult medical examination without abnormal findings: Secondary | ICD-10-CM | POA: Diagnosis not present

## 2016-03-27 DIAGNOSIS — I10 Essential (primary) hypertension: Secondary | ICD-10-CM | POA: Diagnosis not present

## 2016-03-27 DIAGNOSIS — E119 Type 2 diabetes mellitus without complications: Secondary | ICD-10-CM | POA: Diagnosis not present

## 2016-03-27 DIAGNOSIS — G4733 Obstructive sleep apnea (adult) (pediatric): Secondary | ICD-10-CM | POA: Diagnosis not present

## 2016-04-24 DIAGNOSIS — G4733 Obstructive sleep apnea (adult) (pediatric): Secondary | ICD-10-CM | POA: Diagnosis not present

## 2016-04-25 DIAGNOSIS — Z23 Encounter for immunization: Secondary | ICD-10-CM | POA: Diagnosis not present

## 2016-05-25 DIAGNOSIS — G4733 Obstructive sleep apnea (adult) (pediatric): Secondary | ICD-10-CM | POA: Diagnosis not present

## 2016-06-11 DIAGNOSIS — G4733 Obstructive sleep apnea (adult) (pediatric): Secondary | ICD-10-CM | POA: Diagnosis not present

## 2016-07-16 DIAGNOSIS — G4733 Obstructive sleep apnea (adult) (pediatric): Secondary | ICD-10-CM | POA: Diagnosis not present

## 2016-07-21 DIAGNOSIS — C50919 Malignant neoplasm of unspecified site of unspecified female breast: Secondary | ICD-10-CM

## 2016-07-21 HISTORY — PX: COLONOSCOPY: SHX174

## 2016-07-21 HISTORY — DX: Malignant neoplasm of unspecified site of unspecified female breast: C50.919

## 2016-07-21 HISTORY — PX: POLYPECTOMY: SHX149

## 2016-08-20 DIAGNOSIS — M5416 Radiculopathy, lumbar region: Secondary | ICD-10-CM | POA: Diagnosis not present

## 2016-08-20 DIAGNOSIS — Z9989 Dependence on other enabling machines and devices: Secondary | ICD-10-CM | POA: Diagnosis not present

## 2016-08-20 DIAGNOSIS — G4733 Obstructive sleep apnea (adult) (pediatric): Secondary | ICD-10-CM | POA: Diagnosis not present

## 2016-09-06 DIAGNOSIS — M5136 Other intervertebral disc degeneration, lumbar region: Secondary | ICD-10-CM | POA: Diagnosis not present

## 2016-09-06 DIAGNOSIS — M5127 Other intervertebral disc displacement, lumbosacral region: Secondary | ICD-10-CM | POA: Diagnosis not present

## 2016-09-08 DIAGNOSIS — M5441 Lumbago with sciatica, right side: Secondary | ICD-10-CM | POA: Diagnosis not present

## 2016-09-08 DIAGNOSIS — G8929 Other chronic pain: Secondary | ICD-10-CM | POA: Diagnosis not present

## 2016-09-17 DIAGNOSIS — H01004 Unspecified blepharitis left upper eyelid: Secondary | ICD-10-CM | POA: Diagnosis not present

## 2016-09-17 DIAGNOSIS — H01002 Unspecified blepharitis right lower eyelid: Secondary | ICD-10-CM | POA: Diagnosis not present

## 2016-09-17 DIAGNOSIS — H01001 Unspecified blepharitis right upper eyelid: Secondary | ICD-10-CM | POA: Diagnosis not present

## 2016-09-17 DIAGNOSIS — H01005 Unspecified blepharitis left lower eyelid: Secondary | ICD-10-CM | POA: Diagnosis not present

## 2016-09-17 DIAGNOSIS — H2511 Age-related nuclear cataract, right eye: Secondary | ICD-10-CM | POA: Diagnosis not present

## 2016-09-24 DIAGNOSIS — E119 Type 2 diabetes mellitus without complications: Secondary | ICD-10-CM | POA: Diagnosis not present

## 2016-09-24 DIAGNOSIS — H01004 Unspecified blepharitis left upper eyelid: Secondary | ICD-10-CM | POA: Diagnosis not present

## 2016-09-24 DIAGNOSIS — H16222 Keratoconjunctivitis sicca, not specified as Sjogren's, left eye: Secondary | ICD-10-CM | POA: Diagnosis not present

## 2016-09-24 DIAGNOSIS — E784 Other hyperlipidemia: Secondary | ICD-10-CM | POA: Diagnosis not present

## 2016-09-24 DIAGNOSIS — E669 Obesity, unspecified: Secondary | ICD-10-CM | POA: Diagnosis not present

## 2016-09-24 DIAGNOSIS — H16212 Exposure keratoconjunctivitis, left eye: Secondary | ICD-10-CM | POA: Diagnosis not present

## 2016-09-24 DIAGNOSIS — H01005 Unspecified blepharitis left lower eyelid: Secondary | ICD-10-CM | POA: Diagnosis not present

## 2016-09-24 DIAGNOSIS — G4733 Obstructive sleep apnea (adult) (pediatric): Secondary | ICD-10-CM | POA: Diagnosis not present

## 2016-09-24 DIAGNOSIS — Z9989 Dependence on other enabling machines and devices: Secondary | ICD-10-CM | POA: Diagnosis not present

## 2016-09-24 DIAGNOSIS — I10 Essential (primary) hypertension: Secondary | ICD-10-CM | POA: Diagnosis not present

## 2016-09-26 ENCOUNTER — Encounter: Payer: Self-pay | Admitting: Gastroenterology

## 2016-10-03 DIAGNOSIS — M5441 Lumbago with sciatica, right side: Secondary | ICD-10-CM | POA: Diagnosis not present

## 2016-10-03 DIAGNOSIS — G8929 Other chronic pain: Secondary | ICD-10-CM | POA: Diagnosis not present

## 2016-10-03 DIAGNOSIS — M545 Low back pain: Secondary | ICD-10-CM | POA: Diagnosis not present

## 2016-10-03 DIAGNOSIS — M79604 Pain in right leg: Secondary | ICD-10-CM | POA: Diagnosis not present

## 2016-10-06 DIAGNOSIS — M545 Low back pain: Secondary | ICD-10-CM | POA: Diagnosis not present

## 2016-10-06 DIAGNOSIS — M5441 Lumbago with sciatica, right side: Secondary | ICD-10-CM | POA: Diagnosis not present

## 2016-10-06 DIAGNOSIS — M79604 Pain in right leg: Secondary | ICD-10-CM | POA: Diagnosis not present

## 2016-10-06 DIAGNOSIS — G8929 Other chronic pain: Secondary | ICD-10-CM | POA: Diagnosis not present

## 2016-10-28 DIAGNOSIS — G8929 Other chronic pain: Secondary | ICD-10-CM | POA: Diagnosis not present

## 2016-10-28 DIAGNOSIS — M5441 Lumbago with sciatica, right side: Secondary | ICD-10-CM | POA: Diagnosis not present

## 2016-11-04 DIAGNOSIS — M5441 Lumbago with sciatica, right side: Secondary | ICD-10-CM | POA: Diagnosis not present

## 2016-11-04 DIAGNOSIS — G8929 Other chronic pain: Secondary | ICD-10-CM | POA: Diagnosis not present

## 2016-11-05 ENCOUNTER — Ambulatory Visit (AMBULATORY_SURGERY_CENTER): Payer: Self-pay | Admitting: *Deleted

## 2016-11-05 VITALS — Ht 62.0 in | Wt 204.0 lb

## 2016-11-05 DIAGNOSIS — G4733 Obstructive sleep apnea (adult) (pediatric): Secondary | ICD-10-CM | POA: Insufficient documentation

## 2016-11-05 DIAGNOSIS — E119 Type 2 diabetes mellitus without complications: Secondary | ICD-10-CM | POA: Insufficient documentation

## 2016-11-05 DIAGNOSIS — K760 Fatty (change of) liver, not elsewhere classified: Secondary | ICD-10-CM | POA: Insufficient documentation

## 2016-11-05 DIAGNOSIS — Z9989 Dependence on other enabling machines and devices: Secondary | ICD-10-CM

## 2016-11-05 DIAGNOSIS — K5790 Diverticulosis of intestine, part unspecified, without perforation or abscess without bleeding: Secondary | ICD-10-CM | POA: Insufficient documentation

## 2016-11-05 DIAGNOSIS — E785 Hyperlipidemia, unspecified: Secondary | ICD-10-CM | POA: Insufficient documentation

## 2016-11-05 DIAGNOSIS — Z86010 Personal history of colonic polyps: Secondary | ICD-10-CM

## 2016-11-05 DIAGNOSIS — I10 Essential (primary) hypertension: Secondary | ICD-10-CM | POA: Insufficient documentation

## 2016-11-05 DIAGNOSIS — E669 Obesity, unspecified: Secondary | ICD-10-CM | POA: Insufficient documentation

## 2016-11-05 MED ORDER — NA SULFATE-K SULFATE-MG SULF 17.5-3.13-1.6 GM/177ML PO SOLN: ORAL | 0 refills | Status: DC

## 2016-11-05 NOTE — Progress Notes (Signed)
Patient denies any allergies to eggs or soy. Patient denies any problems with anesthesia/sedation. Patient denies any oxygen use at home and does not take any diet/weight loss medications. EMMI education assisgned to patient on colonoscopy, this was explained and instructions given to patient. 

## 2016-11-06 ENCOUNTER — Encounter: Payer: Self-pay | Admitting: Gastroenterology

## 2016-11-19 ENCOUNTER — Encounter: Payer: Self-pay | Admitting: Gastroenterology

## 2016-11-19 ENCOUNTER — Ambulatory Visit (AMBULATORY_SURGERY_CENTER): Payer: PPO | Admitting: Gastroenterology

## 2016-11-19 VITALS — BP 121/62 | HR 60 | Temp 98.2°F | Resp 14 | Ht 62.0 in | Wt 204.0 lb

## 2016-11-19 DIAGNOSIS — E669 Obesity, unspecified: Secondary | ICD-10-CM | POA: Diagnosis not present

## 2016-11-19 DIAGNOSIS — K5732 Diverticulitis of large intestine without perforation or abscess without bleeding: Secondary | ICD-10-CM | POA: Diagnosis not present

## 2016-11-19 DIAGNOSIS — E119 Type 2 diabetes mellitus without complications: Secondary | ICD-10-CM | POA: Diagnosis not present

## 2016-11-19 DIAGNOSIS — Z860100 Personal history of colon polyps, unspecified: Secondary | ICD-10-CM

## 2016-11-19 DIAGNOSIS — K648 Other hemorrhoids: Secondary | ICD-10-CM | POA: Diagnosis not present

## 2016-11-19 DIAGNOSIS — G4733 Obstructive sleep apnea (adult) (pediatric): Secondary | ICD-10-CM | POA: Diagnosis not present

## 2016-11-19 DIAGNOSIS — D120 Benign neoplasm of cecum: Secondary | ICD-10-CM | POA: Diagnosis not present

## 2016-11-19 DIAGNOSIS — I10 Essential (primary) hypertension: Secondary | ICD-10-CM | POA: Diagnosis not present

## 2016-11-19 DIAGNOSIS — D125 Benign neoplasm of sigmoid colon: Secondary | ICD-10-CM | POA: Diagnosis not present

## 2016-11-19 MED ORDER — SODIUM CHLORIDE 0.9 % IV SOLN
500.0000 mL | INTRAVENOUS | Status: DC
Start: 2016-11-19 — End: 2022-04-01

## 2016-11-19 NOTE — Progress Notes (Signed)
Called to room to assist during endoscopic procedure.  Patient ID and intended procedure confirmed with present staff. Received instructions for my participation in the procedure from the performing physician.  

## 2016-11-19 NOTE — Progress Notes (Signed)
To PACU, vss patent aw report to rn 

## 2016-11-19 NOTE — Patient Instructions (Signed)
YOU HAD AN ENDOSCOPIC PROCEDURE TODAY AT THE [facility]:   Refer to the procedure report that was given to you for any specific questions about what was found during the examination.  If the procedure report does not answer your questions, please call your gastroenterologist to clarify.  If you requested that your care partner not be given the details of your procedure findings, then the procedure report has been included in a sealed envelope for you to review at your convenience later.  YOU SHOULD EXPECT: Some feelings of bloating in the abdomen. Passage of more gas than usual.  Walking can help get rid of the air that was put into your GI tract during the procedure and reduce the bloating. If you had a lower endoscopy (such as a colonoscopy or flexible sigmoidoscopy) you may notice spotting of blood in your stool or on the toilet paper. If you underwent a bowel prep for your procedure, you may not have a normal bowel movement for a few days.  Please Note:  You might notice some irritation and congestion in your nose or some drainage.  This is from the oxygen used during your procedure.  There is no need for concern and it should clear up in a day or so.  SYMPTOMS TO REPORT IMMEDIATELY:   Following lower endoscopy (colonoscopy or flexible sigmoidoscopy):  Excessive amounts of blood in the stool  Significant tenderness or worsening of abdominal pains  Swelling of the abdomen that is new, acute  Fever of 100F or higher   For urgent or emergent issues, a gastroenterologist can be reached at any hour by calling [phone].   DIET:  We do recommend a small meal at first, but then you may proceed to your regular diet.  Drink plenty of fluids but you should avoid alcoholic beverages for 24 hours.  ACTIVITY:  You should plan to take it easy for the rest of today and you should NOT DRIVE or use heavy machinery until tomorrow (because of the sedation medicines used during the test).     FOLLOW UP: Our staff will call the number listed on your records the next business day following your procedure to check on you and address any questions or concerns that you may have regarding the information given to you following your procedure. If we do not reach you, we will leave a message.  However, if you are feeling well and you are not experiencing any problems, there is no need to return our call.  We will assume that you have returned to your regular daily activities without incident.  If any biopsies were taken you will be contacted by phone or by letter within the next 1-3 weeks.  Please call us at [phone] if you have not heard about the biopsies in 3 weeks.    SIGNATURES/CONFIDENTIALITY: You and/or your care partner have signed paperwork which will be entered into your electronic medical record.  These signatures attest to the fact that that the information above on your After Visit Summary has been reviewed and is understood.  Full responsibility of the confidentiality of this discharge information lies with you and/or your care-partner.  Polyp, diverticulosis, hemorrhoid information given. 

## 2016-11-19 NOTE — Op Note (Signed)
[facility] Patient Name: Diane Macias Procedure Date: 11/19/2016 9:21 AM MRN: [id number] Endoscopist: Mauri Pole , MD Age: 71 Referring MD:  Date of Birth: 04-23-1946 Gender: Female Account #: [id number] Procedure:                Colonoscopy Indications:              Surveillance: Personal history of adenomatous                            polyps on last colonoscopy 5 years ago Medicines:                Monitored Anesthesia Care Procedure:                Pre-Anesthesia Assessment:                           - Prior to the procedure, a History and Physical                            was performed, and patient medications and                            allergies were reviewed. The patient's tolerance of                            previous anesthesia was also reviewed. The risks                            and benefits of the procedure and the sedation                            options and risks were discussed with the patient.                            All questions were answered, and informed consent                            was obtained. Prior Anticoagulants: The patient has                            taken no previous anticoagulant or antiplatelet                            agents. ASA Grade Assessment: II - A patient with                            mild systemic disease. After reviewing the risks                            and benefits, the patient was deemed in                            satisfactory condition to undergo the procedure.  After obtaining informed consent, the colonoscope                            was passed under direct vision. Throughout the                            procedure, the patient's blood pressure, pulse, and                            oxygen saturations were monitored continuously. The                            Colonoscope was introduced through the anus and                            advanced to the the  terminal ileum, with                            identification of the appendiceal orifice and IC                            valve. The colonoscopy was performed without                            difficulty. The patient tolerated the procedure                            well. The quality of the bowel preparation was                            good. The terminal ileum, ileocecal valve,                            appendiceal orifice, and rectum were photographed. Scope In: 9:32:35 AM Scope Out: 9:52:58 AM Scope Withdrawal Time: 0 hours 12 minutes 42 seconds  Total Procedure Duration: 0 hours 20 minutes 23 seconds  Findings:                 The perianal and digital rectal examinations were                            normal.                           A 5 mm polyp was found in the cecum. The polyp was                            sessile. The polyp was removed with a cold snare.                            Resection and retrieval were complete.                           A 2 mm polyp was found in the cecum. The polyp  was                            sessile. The polyp was removed with a cold biopsy                            forceps. Resection and retrieval were complete.                           A 8 mm polyp was found in the sigmoid colon. The                            polyp was semi-pedunculated. The polyp was removed                            with a hot snare. Resection and retrieval were                            complete.                           Multiple small and large-mouthed diverticula were                            found in the sigmoid colon and descending colon.                           Non-bleeding internal hemorrhoids were found during                            retroflexion. The hemorrhoids were small. Complications:            No immediate complications. Estimated Blood Loss:     Estimated blood loss was minimal. Impression:               - One 5 mm polyp in the cecum,  removed with a cold                            snare. Resected and retrieved.                           - One 2 mm polyp in the cecum, removed with a cold                            biopsy forceps. Resected and retrieved.                           - One 8 mm polyp in the sigmoid colon, removed with                            a hot snare. Resected and retrieved.                           - Diverticulosis in the sigmoid colon and in the  descending colon.                           - Non-bleeding internal hemorrhoids. Recommendation:           - Patient has a contact number available for                            emergencies. The signs and symptoms of potential                            delayed complications were discussed with the                            patient. Return to normal activities tomorrow.                            Written discharge instructions were provided to the                            patient.                           - Resume previous diet.                           - Continue present medications.                           - Await pathology results.                           - Repeat colonoscopy in 5 years for surveillance                            based on pathology results.                           - Return to GI clinic PRN. Mauri Pole, MD 11/19/2016 9:57:24 AM This report has been signed electronically.

## 2016-11-20 ENCOUNTER — Telehealth: Payer: Self-pay

## 2016-11-20 DIAGNOSIS — M5441 Lumbago with sciatica, right side: Secondary | ICD-10-CM | POA: Diagnosis not present

## 2016-11-20 DIAGNOSIS — G8929 Other chronic pain: Secondary | ICD-10-CM | POA: Diagnosis not present

## 2016-11-20 NOTE — Telephone Encounter (Signed)
  Follow up Call-  Call back number 11/19/2016  Post procedure Call Back phone  # [phone]  Permission to leave phone message Yes  Some recent data might be hidden     Patient questions:  Do you have a fever, pain , or abdominal swelling? No. Pain Score  0 *  Have you tolerated food without any problems? Yes.    Have you been able to return to your normal activities? Yes.    Do you have any questions about your discharge instructions: Diet   No. Medications  No. Follow up visit  No.  Do you have questions or concerns about your Care? No.  Actions: * If pain score is 4 or above: No action needed, pain <4.

## 2016-11-26 ENCOUNTER — Encounter: Payer: Self-pay | Admitting: Gastroenterology

## 2016-12-04 DIAGNOSIS — M5441 Lumbago with sciatica, right side: Secondary | ICD-10-CM | POA: Diagnosis not present

## 2016-12-04 DIAGNOSIS — G8929 Other chronic pain: Secondary | ICD-10-CM | POA: Diagnosis not present

## 2016-12-04 DIAGNOSIS — M545 Low back pain: Secondary | ICD-10-CM | POA: Diagnosis not present

## 2016-12-04 DIAGNOSIS — M79604 Pain in right leg: Secondary | ICD-10-CM | POA: Diagnosis not present

## 2016-12-05 DIAGNOSIS — Z1231 Encounter for screening mammogram for malignant neoplasm of breast: Secondary | ICD-10-CM | POA: Diagnosis not present

## 2016-12-10 DIAGNOSIS — G4733 Obstructive sleep apnea (adult) (pediatric): Secondary | ICD-10-CM | POA: Diagnosis not present

## 2016-12-11 DIAGNOSIS — N6313 Unspecified lump in the right breast, lower outer quadrant: Secondary | ICD-10-CM | POA: Diagnosis not present

## 2016-12-11 DIAGNOSIS — N6310 Unspecified lump in the right breast, unspecified quadrant: Secondary | ICD-10-CM | POA: Diagnosis not present

## 2016-12-11 DIAGNOSIS — N6311 Unspecified lump in the right breast, upper outer quadrant: Secondary | ICD-10-CM | POA: Diagnosis not present

## 2016-12-11 DIAGNOSIS — R928 Other abnormal and inconclusive findings on diagnostic imaging of breast: Secondary | ICD-10-CM | POA: Diagnosis not present

## 2016-12-18 DIAGNOSIS — C50811 Malignant neoplasm of overlapping sites of right female breast: Secondary | ICD-10-CM | POA: Diagnosis not present

## 2016-12-18 DIAGNOSIS — R928 Other abnormal and inconclusive findings on diagnostic imaging of breast: Secondary | ICD-10-CM | POA: Diagnosis not present

## 2016-12-18 DIAGNOSIS — Z170 Estrogen receptor positive status [ER+]: Secondary | ICD-10-CM | POA: Diagnosis not present

## 2016-12-19 DIAGNOSIS — G4733 Obstructive sleep apnea (adult) (pediatric): Secondary | ICD-10-CM | POA: Diagnosis not present

## 2016-12-25 DIAGNOSIS — C50911 Malignant neoplasm of unspecified site of right female breast: Secondary | ICD-10-CM | POA: Diagnosis not present

## 2016-12-25 DIAGNOSIS — Z9889 Other specified postprocedural states: Secondary | ICD-10-CM | POA: Diagnosis not present

## 2016-12-26 DIAGNOSIS — M545 Low back pain: Secondary | ICD-10-CM | POA: Diagnosis not present

## 2017-01-07 DIAGNOSIS — C50411 Malignant neoplasm of upper-outer quadrant of right female breast: Secondary | ICD-10-CM | POA: Diagnosis not present

## 2017-01-09 DIAGNOSIS — C50911 Malignant neoplasm of unspecified site of right female breast: Secondary | ICD-10-CM | POA: Diagnosis not present

## 2017-01-09 DIAGNOSIS — Z803 Family history of malignant neoplasm of breast: Secondary | ICD-10-CM | POA: Diagnosis not present

## 2017-01-09 DIAGNOSIS — Z170 Estrogen receptor positive status [ER+]: Secondary | ICD-10-CM | POA: Diagnosis not present

## 2017-01-19 DIAGNOSIS — C50411 Malignant neoplasm of upper-outer quadrant of right female breast: Secondary | ICD-10-CM | POA: Diagnosis not present

## 2017-01-19 DIAGNOSIS — C50911 Malignant neoplasm of unspecified site of right female breast: Secondary | ICD-10-CM | POA: Diagnosis not present

## 2017-01-22 DIAGNOSIS — R001 Bradycardia, unspecified: Secondary | ICD-10-CM | POA: Diagnosis not present

## 2017-01-22 DIAGNOSIS — E119 Type 2 diabetes mellitus without complications: Secondary | ICD-10-CM | POA: Diagnosis not present

## 2017-01-22 DIAGNOSIS — G8929 Other chronic pain: Secondary | ICD-10-CM | POA: Diagnosis not present

## 2017-01-22 DIAGNOSIS — G4733 Obstructive sleep apnea (adult) (pediatric): Secondary | ICD-10-CM | POA: Diagnosis not present

## 2017-01-22 DIAGNOSIS — Z7984 Long term (current) use of oral hypoglycemic drugs: Secondary | ICD-10-CM | POA: Diagnosis not present

## 2017-01-22 DIAGNOSIS — I10 Essential (primary) hypertension: Secondary | ICD-10-CM | POA: Diagnosis not present

## 2017-01-22 DIAGNOSIS — E785 Hyperlipidemia, unspecified: Secondary | ICD-10-CM | POA: Diagnosis not present

## 2017-01-22 DIAGNOSIS — Z01818 Encounter for other preprocedural examination: Secondary | ICD-10-CM | POA: Diagnosis not present

## 2017-01-22 DIAGNOSIS — M549 Dorsalgia, unspecified: Secondary | ICD-10-CM | POA: Diagnosis not present

## 2017-02-06 DIAGNOSIS — Z170 Estrogen receptor positive status [ER+]: Secondary | ICD-10-CM | POA: Diagnosis not present

## 2017-02-06 DIAGNOSIS — C50511 Malignant neoplasm of lower-outer quadrant of right female breast: Secondary | ICD-10-CM | POA: Diagnosis not present

## 2017-02-11 DIAGNOSIS — C50911 Malignant neoplasm of unspecified site of right female breast: Secondary | ICD-10-CM | POA: Diagnosis not present

## 2017-02-11 DIAGNOSIS — Z170 Estrogen receptor positive status [ER+]: Secondary | ICD-10-CM | POA: Diagnosis not present

## 2017-02-13 DIAGNOSIS — I10 Essential (primary) hypertension: Secondary | ICD-10-CM | POA: Diagnosis not present

## 2017-02-13 DIAGNOSIS — Z882 Allergy status to sulfonamides status: Secondary | ICD-10-CM | POA: Diagnosis not present

## 2017-02-13 DIAGNOSIS — Z7984 Long term (current) use of oral hypoglycemic drugs: Secondary | ICD-10-CM | POA: Diagnosis not present

## 2017-02-13 DIAGNOSIS — Z7982 Long term (current) use of aspirin: Secondary | ICD-10-CM | POA: Diagnosis not present

## 2017-02-13 DIAGNOSIS — E785 Hyperlipidemia, unspecified: Secondary | ICD-10-CM | POA: Diagnosis not present

## 2017-02-13 DIAGNOSIS — Z885 Allergy status to narcotic agent status: Secondary | ICD-10-CM | POA: Diagnosis not present

## 2017-02-13 DIAGNOSIS — Z888 Allergy status to other drugs, medicaments and biological substances status: Secondary | ICD-10-CM | POA: Diagnosis not present

## 2017-02-13 DIAGNOSIS — Z886 Allergy status to analgesic agent status: Secondary | ICD-10-CM | POA: Diagnosis not present

## 2017-02-13 DIAGNOSIS — E669 Obesity, unspecified: Secondary | ICD-10-CM | POA: Diagnosis not present

## 2017-02-13 DIAGNOSIS — E119 Type 2 diabetes mellitus without complications: Secondary | ICD-10-CM | POA: Diagnosis not present

## 2017-02-13 DIAGNOSIS — C50911 Malignant neoplasm of unspecified site of right female breast: Secondary | ICD-10-CM | POA: Diagnosis not present

## 2017-02-13 DIAGNOSIS — Z881 Allergy status to other antibiotic agents status: Secondary | ICD-10-CM | POA: Diagnosis not present

## 2017-02-13 DIAGNOSIS — Z79899 Other long term (current) drug therapy: Secondary | ICD-10-CM | POA: Diagnosis not present

## 2017-02-17 DIAGNOSIS — C50911 Malignant neoplasm of unspecified site of right female breast: Secondary | ICD-10-CM | POA: Diagnosis not present

## 2017-02-20 DIAGNOSIS — N92 Excessive, frequent and irregular menstruation: Secondary | ICD-10-CM | POA: Diagnosis not present

## 2017-02-20 DIAGNOSIS — C50911 Malignant neoplasm of unspecified site of right female breast: Secondary | ICD-10-CM | POA: Diagnosis not present

## 2017-02-20 DIAGNOSIS — D0501 Lobular carcinoma in situ of right breast: Secondary | ICD-10-CM | POA: Diagnosis not present

## 2017-02-20 DIAGNOSIS — C50511 Malignant neoplasm of lower-outer quadrant of right female breast: Secondary | ICD-10-CM | POA: Diagnosis not present

## 2017-02-20 DIAGNOSIS — N6091 Unspecified benign mammary dysplasia of right breast: Secondary | ICD-10-CM | POA: Diagnosis not present

## 2017-02-20 DIAGNOSIS — N62 Hypertrophy of breast: Secondary | ICD-10-CM | POA: Diagnosis not present

## 2017-02-20 DIAGNOSIS — C50811 Malignant neoplasm of overlapping sites of right female breast: Secondary | ICD-10-CM | POA: Diagnosis not present

## 2017-02-27 DIAGNOSIS — Z483 Aftercare following surgery for neoplasm: Secondary | ICD-10-CM | POA: Diagnosis not present

## 2017-02-27 DIAGNOSIS — C50911 Malignant neoplasm of unspecified site of right female breast: Secondary | ICD-10-CM | POA: Diagnosis not present

## 2017-02-27 DIAGNOSIS — Z170 Estrogen receptor positive status [ER+]: Secondary | ICD-10-CM | POA: Diagnosis not present

## 2017-03-03 ENCOUNTER — Telehealth: Payer: Self-pay | Admitting: Oncology

## 2017-03-03 ENCOUNTER — Encounter: Payer: Self-pay | Admitting: Oncology

## 2017-03-03 NOTE — Telephone Encounter (Signed)
Appt has been scheduled for the pt to see Dr. Jana Hakim on 8/28 at 4pm. Appt given to the referring office and leterr mailed to the pt.

## 2017-03-12 DIAGNOSIS — C50911 Malignant neoplasm of unspecified site of right female breast: Secondary | ICD-10-CM | POA: Diagnosis not present

## 2017-03-12 DIAGNOSIS — Z9889 Other specified postprocedural states: Secondary | ICD-10-CM | POA: Diagnosis not present

## 2017-03-12 DIAGNOSIS — Z170 Estrogen receptor positive status [ER+]: Secondary | ICD-10-CM | POA: Diagnosis not present

## 2017-03-16 ENCOUNTER — Other Ambulatory Visit: Payer: Self-pay | Admitting: *Deleted

## 2017-03-16 DIAGNOSIS — C50919 Malignant neoplasm of unspecified site of unspecified female breast: Secondary | ICD-10-CM

## 2017-03-17 ENCOUNTER — Ambulatory Visit (HOSPITAL_BASED_OUTPATIENT_CLINIC_OR_DEPARTMENT_OTHER): Payer: PPO | Admitting: Oncology

## 2017-03-17 ENCOUNTER — Other Ambulatory Visit (HOSPITAL_BASED_OUTPATIENT_CLINIC_OR_DEPARTMENT_OTHER): Payer: PPO

## 2017-03-17 DIAGNOSIS — Z170 Estrogen receptor positive status [ER+]: Secondary | ICD-10-CM

## 2017-03-17 DIAGNOSIS — C50811 Malignant neoplasm of overlapping sites of right female breast: Secondary | ICD-10-CM | POA: Insufficient documentation

## 2017-03-17 DIAGNOSIS — C50911 Malignant neoplasm of unspecified site of right female breast: Secondary | ICD-10-CM | POA: Diagnosis not present

## 2017-03-17 DIAGNOSIS — C50919 Malignant neoplasm of unspecified site of unspecified female breast: Secondary | ICD-10-CM

## 2017-03-17 LAB — CBC WITH DIFFERENTIAL/PLATELET
BASO%: 0.8 % (ref 0.0–2.0)
BASOS ABS: 0.1 10*3/uL (ref 0.0–0.1)
EOS%: 1.4 % (ref 0.0–7.0)
Eosinophils Absolute: 0.1 10*3/uL (ref 0.0–0.5)
HEMATOCRIT: 34.6 % — AB (ref 34.8–46.6)
HGB: 11.5 g/dL — ABNORMAL LOW (ref 11.6–15.9)
LYMPH#: 2.9 10*3/uL (ref 0.9–3.3)
LYMPH%: 34.1 % (ref 14.0–49.7)
MCH: 30.2 pg (ref 25.1–34.0)
MCHC: 33.2 g/dL (ref 31.5–36.0)
MCV: 90.9 fL (ref 79.5–101.0)
MONO#: 0.6 10*3/uL (ref 0.1–0.9)
MONO%: 6.8 % (ref 0.0–14.0)
NEUT#: 4.9 10*3/uL (ref 1.5–6.5)
NEUT%: 56.9 % (ref 38.4–76.8)
PLATELETS: 172 10*3/uL (ref 145–400)
RBC: 3.81 10*6/uL (ref 3.70–5.45)
RDW: 13.6 % (ref 11.2–14.5)
WBC: 8.6 10*3/uL (ref 3.9–10.3)

## 2017-03-17 LAB — COMPREHENSIVE METABOLIC PANEL
ALT: 16 U/L (ref 0–55)
ANION GAP: 7 meq/L (ref 3–11)
AST: 15 U/L (ref 5–34)
Albumin: 3.8 g/dL (ref 3.5–5.0)
Alkaline Phosphatase: 88 U/L (ref 40–150)
BUN: 28.6 mg/dL — ABNORMAL HIGH (ref 7.0–26.0)
CALCIUM: 9.8 mg/dL (ref 8.4–10.4)
CHLORIDE: 109 meq/L (ref 98–109)
CO2: 23 mEq/L (ref 22–29)
Creatinine: 0.9 mg/dL (ref 0.6–1.1)
EGFR: 67 mL/min/{1.73_m2} — AB (ref >90)
Glucose: 102 mg/dl (ref 70–140)
POTASSIUM: 4.2 meq/L (ref 3.5–5.1)
Sodium: 139 mEq/L (ref 136–145)
Total Bilirubin: 0.31 mg/dL (ref 0.20–1.20)
Total Protein: 6.8 g/dL (ref 6.4–8.3)

## 2017-03-17 LAB — DRAW EXTRA CLOT TUBE

## 2017-03-17 NOTE — Progress Notes (Signed)
[facility]  [phone] [phone]     ID: Diane Macias DOB: 1946/02/24  MR#: [id number]  HLK#:[id number]  Patient Care Team: Drake Leach, MD as PCP - General (Internal Medicine) Angelina Ok, MD as Referring Physician (Surgery) Zarie Kosiba, Virgie Dad, MD as Consulting Physician (Oncology) Thea Silversmith, MD (Inactive) as Referring Physician (Radiation Oncology) Mauri Pole, MD as Consulting Physician (Gastroenterology) Chauncey Cruel, MD OTHER MD:  CHIEF COMPLAINT: Estrogen receptor positive lobular breast cancer  CURRENT TREATMENT: Adjuvant radiation pending   HISTORY OF CURRENT ILLNESS: Diane Macias had bilateral screening mammography at [facility] 11/30/2014 showing the breast density to be category B. There was a possible mass in the right breast. On 12/10/2016 she underwent right diagnostic mammography with tomography and right breast ultrasonography. This confirmed a 0.4 cm spiculated mass in the periareolar area of the right breast. Ultrasound showed a small irregular hypoechoic mass at the 9:00 position 1 cm from the nipple, measuring 0.5 cm. Ultrasound of the right axilla was benign.  Biopsy of the right breast area in question 12/25/2016 showed an invasive lobular carcinoma, grade 2, estrogen receptor positive at 85%, progesterone receptor positive at 65%, both with strong staining intensity, and HER-2 not amplified by immunohistochemistry (1+).   On 01/19/2017 she underwent bilateral breast MRIs which showed in addition to the known right breast mass an additional area of concern in the central right breast. This measured 0.6 cm. MRI guided biopsy 02/06/2017 confirmed that this second area also was invasive lobular carcinoma, grade 2. It was located (by MRI) 1.5 cm inferior medial to the previously biopsied mass. Also there was a 0.6 mm sternal lesion in the inferior aspect of the sternum.  Because of the sternal lesion on the  patient underwent PET scanning 02/12/2017. This was entirely negative and the sternal lesion is felt to be most consistent with a bone island  On 02/20/2017 the patient underwent right lumpectomy. This showed ([id number]) invasive lobular carcinoma measuring 0.5 cm, grade 2, with evidence of lobular carcinoma in situ. Margins were clear. Both sentinel lymph nodes were clear. [NB: the second biopsied lesion was present in the surgical sample but not addressed in the report; I have discussed this with pathology and Dr Catha Brow has kindly agreed to review the case, measure this lesion and margins, and dictate an addendum]  The patient's subsequent history is as detailed below.  INTERVAL HISTORY: Diane Macias was evaluated in the breast clinic 03/17/2017 accompanied by her husband [name] and their daughter Wells Guiles.   REVIEW OF SYSTEMS: There were no specific symptoms leading to the original mammogram, which was routinely scheduled. The patient denies unusual headaches, visual changes, nausea, vomiting, stiff neck, dizziness, or gait imbalance. There has been no cough, phlegm production, or pleurisy, no chest pain or pressure, and no change in bowel or bladder habits. The patient denies fever, rash, bleeding, unexplained fatigue or unexplained weight loss. Diane Macias did well with the surgery. She does have some discomfort and tingling in the surgical breast which is benign unexpected. A detailed review of systems was otherwise entirely negative.   PAST MEDICAL HISTORY: Past Medical History:  Diagnosis Date  . Adenomatous colon polyp 04/18/2011  . Allergy   . Arthritis   . Cancer ([place]) 1997   squamous and basal cell  . Diabetes mellitus    type II  . Hyperlipidemia   . Hypertension   . Osteopenia   . Sleep apnea    CPAP    PAST SURGICAL HISTORY: Past  Surgical History:  Procedure Laterality Date  . arthroscopic knee  01/2008   right knee  . CATARACT EXTRACTION Left 2016  . CHOLECYSTECTOMY  1981  .  COLONOSCOPY    . FOOT SURGERY  07/2007   right foot (BUNION)  . KNEE ARTHROSCOPY     left  . TONSILLECTOMY  1953  . torn maniscus  11/2010   left knee  . VAGINAL HYSTERECTOMY  1994    FAMILY HISTORY Family History  Problem Relation Age of Onset  . Lung cancer Father 65  . Colon cancer Neg Hx   The patient's father died from lung cancer in the setting of tobacco abuse. He was 71 years old. The patient's mother died at 99 with Alzheimer's disease. The patient had no brothers, 2 sisters. One sister was diagnosed with breast cancer at age 20. There is also a maternal cousin with breast cancer. There is no history of ovarian or prostate cancer in the family. The patient was genetically tested at [facility] and reportedly carries a heterozygous MutYH mutation. I do have requested that report  GYNECOLOGIC HISTORY:  No LMP recorded. Patient has had a hysterectomy. Menarche age 55, first live birth age 39, the patient is [place] P2. She underwent total abdominal hysterectomy with bilateral salpingo-oophorectomy approximately age 55. She took hormone replacement for approximately 17 years.  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 [name] is a retired Scientist, water quality. Daughter [name] is a Risk manager in [place]. Daughter [name] is Web designer at the country day school locally. The patient has 3 grandchildren. She is a Tourist information centre manager.    ADVANCED DIRECTIVES:    HEALTH MAINTENANCE: Social History  Substance Use Topics  . Smoking status: Former Smoker    Quit date: 07/21/1968  . Smokeless tobacco: Never Used  . Alcohol use Yes     Comment: rarely wine     Colonoscopy: May 2018; Nandigam  PAP:  Bone density:   Allergies  Allergen Reactions  . Ciprofloxacin Swelling and Rash    other  . Flagyl [Metronidazole] Swelling  . Celecoxib Swelling  . Diazepam Other (See Comments)    dpression  . Hydrocodone Other (See Comments)    Could not sleep  .  Oxycodone Other (See Comments)    Restless  . Sulfa Antibiotics Rash  . Sulfonamide Derivatives Rash    Current Outpatient Prescriptions  Medication Sig Dispense Refill  . aspirin 81 MG tablet Take 81 mg by mouth 2 (two) times a week.      . cholecalciferol (VITAMIN D) 1000 UNITS tablet Take 1,000 Units by mouth daily.      Marland Kitchen glucose blood test strip 1 each by Other route as needed for other. Use as instructed    . Ibuprofen (ADVIL) 200 MG CAPS Take 2 capsules by mouth as needed.    Marland Kitchen lisinopril (PRINIVIL,ZESTRIL) 10 MG tablet Take 10 mg by mouth daily.      . metFORMIN (GLUCOPHAGE) 500 MG tablet Take 500 mg by mouth daily after supper.      . ONE TOUCH LANCETS MISC by Does not apply route.    . pravastatin (PRAVACHOL) 10 MG tablet Take 10 mg by mouth daily.      . Probiotic Product (ALIGN PO) Take 1 capsule by mouth daily.    . vitamin B-12 (CYANOCOBALAMIN) 1000 MCG tablet Take 1,000 mcg by mouth daily.     Current Facility-Administered Medications  Medication Dose Route Frequency Provider Last  Rate Last Dose  . 0.9 %  sodium chloride infusion  500 mL Intravenous Continuous Nandigam, Eleonore Chiquito, MD        OBJECTIVE: Middle-aged white woman in no acute distress  Vitals:   03/17/17 1557  BP: (!) 168/67  Pulse: 80  Resp: 18  Temp: 98.2 F (36.8 C)  SpO2: 96%     Body mass index is 37.55 kg/m.   Wt Readings from Last 3 Encounters:  03/17/17 205 lb 4.8 oz (93.1 kg)  11/19/16 204 lb (92.5 kg)  11/05/16 204 lb (92.5 kg)      ECOG FS:0 - Asymptomatic  Ocular: Sclerae unicteric, pupils round and equal Ear-nose-throat: Oropharynx clear and moist Lymphatic: No cervical or supraclavicular adenopathy Lungs no rales or rhonchi Heart regular rate and rhythm Abd soft, nontender, positive bowel sounds MSK no focal spinal tenderness, no joint edema Neuro: non-focal, well-oriented, appropriate affect Breasts: The right breast is status post recent lumpectomy. The inferior is on the  inframammary side and Steri-Strips are still in place. There is no dehiscence, erythema, or swelling. The cosmetic result is excellent. The left breast is unremarkable. Both axillae are benign.   LAB RESULTS:  CMP     Component Value Date/Time   NA 139 03/17/2017 1524   K 4.2 03/17/2017 1524   CO2 23 03/17/2017 1524   GLUCOSE 102 03/17/2017 1524   BUN 28.6 (H) 03/17/2017 1524   CREATININE 0.9 03/17/2017 1524   CALCIUM 9.8 03/17/2017 1524   PROT 6.8 03/17/2017 1524   ALBUMIN 3.8 03/17/2017 1524   AST 15 03/17/2017 1524   ALT 16 03/17/2017 1524   ALKPHOS 88 03/17/2017 1524   BILITOT 0.31 03/17/2017 1524    No results found for: TOTALPROTELP, ALBUMINELP, A1GS, A2GS, BETS, BETA2SER, GAMS, MSPIKE, SPEI  No results found for: Ron Parker, KAPLAMBRATIO  Lab Results  Component Value Date   WBC 8.6 03/17/2017   NEUTROABS 4.9 03/17/2017   HGB 11.5 (L) 03/17/2017   HCT 34.6 (L) 03/17/2017   MCV 90.9 03/17/2017   PLT 172 03/17/2017      Chemistry      Component Value Date/Time   NA 139 03/17/2017 1524   K 4.2 03/17/2017 1524   CO2 23 03/17/2017 1524   BUN 28.6 (H) 03/17/2017 1524   CREATININE 0.9 03/17/2017 1524      Component Value Date/Time   CALCIUM 9.8 03/17/2017 1524   ALKPHOS 88 03/17/2017 1524   AST 15 03/17/2017 1524   ALT 16 03/17/2017 1524   BILITOT 0.31 03/17/2017 1524       No results found for: LABCA2  No components found for: EABTYW997  No results for input(s): INR in the last 168 hours.  No results found for: LABCA2  No results found for: RML493  No results found for: KYP272  No results found for: BZA963  No results found for: CA2729  No components found for: HGQUANT  No results found for: CEA1 / No results found for: CEA1   No results found for: AFPTUMOR  No results found for: CHROMOGRNA  No results found for: PSA1  Appointment on 03/17/2017  Component Date Value Ref Range Status  . WBC 03/17/2017 8.6  3.9 - 10.3  10e3/uL Final  . NEUT# 03/17/2017 4.9  1.5 - 6.5 10e3/uL Final  . HGB 03/17/2017 11.5* 11.6 - 15.9 g/dL Final  . HCT 21/06/6997 34.6* 34.8 - 46.6 % Final  . Platelets 03/17/2017 172  145 - 400 10e3/uL Final  . MCV  03/17/2017 90.9  79.5 - 101.0 fL Final  . MCH 03/17/2017 30.2  25.1 - 34.0 pg Final  . MCHC 03/17/2017 33.2  31.5 - 36.0 g/dL Final  . RBC 31/43/8887 3.81  3.70 - 5.45 10e6/uL Final  . RDW 03/17/2017 13.6  11.2 - 14.5 % Final  . lymph# 03/17/2017 2.9  0.9 - 3.3 10e3/uL Final  . MONO# 03/17/2017 0.6  0.1 - 0.9 10e3/uL Final  . Eosinophils Absolute 03/17/2017 0.1  0.0 - 0.5 10e3/uL Final  . Basophils Absolute 03/17/2017 0.1  0.0 - 0.1 10e3/uL Final  . NEUT% 03/17/2017 56.9  38.4 - 76.8 % Final  . LYMPH% 03/17/2017 34.1  14.0 - 49.7 % Final  . MONO% 03/17/2017 6.8  0.0 - 14.0 % Final  . EOS% 03/17/2017 1.4  0.0 - 7.0 % Final  . BASO% 03/17/2017 0.8  0.0 - 2.0 % Final  . Sodium 03/17/2017 139  136 - 145 mEq/L Final  . Potassium 03/17/2017 4.2  3.5 - 5.1 mEq/L Final  . Chloride 03/17/2017 109  98 - 109 mEq/L Final  . CO2 03/17/2017 23  22 - 29 mEq/L Final  . Glucose 03/17/2017 102  70 - 140 mg/dl Final   Glucose reference range is for nonfasting patients. Fasting glucose reference range is 70- 100.  Marland Kitchen BUN 03/17/2017 28.6* 7.0 - 26.0 mg/dL Final  . Creatinine 57/97/2820 0.9  0.6 - 1.1 mg/dL Final  . Total Bilirubin 03/17/2017 0.31  0.20 - 1.20 mg/dL Final  . Alkaline Phosphatase 03/17/2017 88  40 - 150 U/L Final  . AST 03/17/2017 15  5 - 34 U/L Final  . ALT 03/17/2017 16  0 - 55 U/L Final  . Total Protein 03/17/2017 6.8  6.4 - 8.3 g/dL Final  . Albumin 60/15/6153 3.8  3.5 - 5.0 g/dL Final  . Calcium 79/43/2761 9.8  8.4 - 10.4 mg/dL Final  . Anion Gap 47/03/2956 7  3 - 11 mEq/L Final  . EGFR 03/17/2017 67* >90 ml/min/1.73 m2 Final   eGFR is calculated using the CKD-EPI Creatinine Equation (2009)  . Extra Tube 03/17/2017 Sample held in lab for 7 days   Final    (this displays  the last labs from the last 3 days)  No results found for: TOTALPROTELP, ALBUMINELP, A1GS, A2GS, BETS, BETA2SER, GAMS, MSPIKE, SPEI (this displays SPEP labs)  No results found for: KPAFRELGTCHN, LAMBDASER, KAPLAMBRATIO (kappa/lambda light chains)  No results found for: HGBA, HGBA2QUANT, HGBFQUANT, HGBSQUAN (Hemoglobinopathy evaluation)   No results found for: LDH  No results found for: IRON, TIBC, IRONPCTSAT (Iron and TIBC)  No results found for: FERRITIN  Urinalysis No results found for: COLORURINE, APPEARANCEUR, LABSPEC, PHURINE, GLUCOSEU, HGBUR, BILIRUBINUR, KETONESUR, PROTEINUR, UROBILINOGEN, NITRITE, LEUKOCYTESUR   STUDIES: Outside studies reviewed with the patient  ELIGIBLE FOR AVAILABLE RESEARCH PROTOCOL: no  ASSESSMENT: 71 y.o. [place], [place] woman status post right lumpectomy 02/20/2017 for 2 separate areas of invasive lobular carcinoma, grade 2, only one addressed in the pathology report, that one being pT1a pN0, stage IA, estrogen and progesterone receptor strongly positive, HER-2 not amplified  (a) the second area of known invasive lobular carcinoma was also included in the surgical sample; its dimensions and margins are to be addressed in a pathology addendum  (1) unless there is a surprise in the pathology addendum, no Oncotype is needed and no chemotherapy is warranted  (2) adjuvant radiation pending  (3) to start anti-estrogens at the conclusion of local therapy  (4) per patient, she  carries a heterozygous MutYH mutation; genetics report has been requested  PLAN: We spent the better part of today's hour-long appointment discussing the biology of her diagnosis and the specifics of her situation. We first reviewed the fact that cancer is not one disease but more than 100 different diseases and that it is important to keep them separate-- otherwise when friends and relatives discuss their own cancer experiences with [facility] confusion can result. Similarly we explained  that if breast cancer spreads to the bone or liver, the patient would not have bone cancer or liver cancer, but breast cancer in the bone and breast cancer in the liver: one cancer in three places-- not 3 different cancers which otherwise would have to be treated in 3 different ways.  We discussed the difference between local and systemic therapy. In terms of loco-regional treatment, she has already had her surgery and is anticipating adjuvant radiation.  Next we went over the options for systemic therapy which are anti-estrogens, anti-HER-2 immunotherapy, and chemotherapy. Leslee does not meet criteria for anti-HER-2 immunotherapy. She is a good candidate for anti-estrogens.  The question of chemotherapy is more complicated. Chemotherapy is most effective in rapidly growing, aggressive tumors. Of course it is also indicated in locally advanced disease. Neither of those situations are the case here. Further, we know from neoadjuvant treatments that lobular breast cancers tend to respond less briskly to chemotherapy then ductal. Finally, neither Oncotype nor chemotherapy are recommended in NCCN guidelines for T1a estrogen receptor positive breast cancers.  In brief, C[place]esi has a very good long-term prognosis without chemotherapy and adding chemotherapy would not make that prognosis better.  Accordingly, pending final pathology review, I feel comfortable bypassing the Oncotype test, avoiding chemotherapy, and moving directly to radiation. I anticipate this will be started within the next 2 or 3 weeks and that it will take 6-1/2 weeks once started. Accordingly I am scheduling married to return to see me in mid November. At that point we will review her options for anti-estrogen treatment and she will be started on her 5 years of systemic therapy.  Colandra has a good understanding of the overall plan. She agrees with it. She knows the goal of treatment in her case is cure. She will call with any problems that may  develop before her next visit here.  Chauncey Cruel, MD   03/17/2017 5:13 PM Medical Oncology and Hematology [facility] [address] [place], [place] [address] Tel. [phone]    Fax. [phone] Okay that that sounds. GU one of the 22 internal order can you but that it

## 2017-03-18 ENCOUNTER — Other Ambulatory Visit: Payer: Self-pay | Admitting: *Deleted

## 2017-03-18 ENCOUNTER — Telehealth: Payer: Self-pay | Admitting: *Deleted

## 2017-03-18 NOTE — Telephone Encounter (Signed)
This RN contacted the [facility] and obtained an

## 2017-03-30 ENCOUNTER — Other Ambulatory Visit: Payer: Self-pay | Admitting: Oncology

## 2017-03-30 DIAGNOSIS — H02831 Dermatochalasis of right upper eyelid: Secondary | ICD-10-CM | POA: Diagnosis not present

## 2017-03-30 DIAGNOSIS — E119 Type 2 diabetes mellitus without complications: Secondary | ICD-10-CM | POA: Diagnosis not present

## 2017-03-30 DIAGNOSIS — H43392 Other vitreous opacities, left eye: Secondary | ICD-10-CM | POA: Diagnosis not present

## 2017-03-30 DIAGNOSIS — H2511 Age-related nuclear cataract, right eye: Secondary | ICD-10-CM | POA: Diagnosis not present

## 2017-03-30 NOTE — Progress Notes (Unsigned)
She underwent right lumpectomy and sentinel lymph node sampling at [facility] under Dr. Elisha Headland on 02/20/2017. The final pathology ([id number]) phone invasive lobular carcinoma, grade 2, measuring 5.0 mm and 2.2 mm, with negative margins. A total of 2 lymph nodes were examined, both clear.

## 2017-04-02 DIAGNOSIS — C50411 Malignant neoplasm of upper-outer quadrant of right female breast: Secondary | ICD-10-CM | POA: Diagnosis not present

## 2017-04-02 DIAGNOSIS — Z170 Estrogen receptor positive status [ER+]: Secondary | ICD-10-CM | POA: Diagnosis not present

## 2017-04-13 DIAGNOSIS — C50411 Malignant neoplasm of upper-outer quadrant of right female breast: Secondary | ICD-10-CM | POA: Diagnosis not present

## 2017-04-13 DIAGNOSIS — Z170 Estrogen receptor positive status [ER+]: Secondary | ICD-10-CM | POA: Diagnosis not present

## 2017-04-16 DIAGNOSIS — Z08 Encounter for follow-up examination after completed treatment for malignant neoplasm: Secondary | ICD-10-CM | POA: Diagnosis not present

## 2017-04-16 DIAGNOSIS — Z85828 Personal history of other malignant neoplasm of skin: Secondary | ICD-10-CM | POA: Diagnosis not present

## 2017-04-16 DIAGNOSIS — L821 Other seborrheic keratosis: Secondary | ICD-10-CM | POA: Diagnosis not present

## 2017-04-23 DIAGNOSIS — G4733 Obstructive sleep apnea (adult) (pediatric): Secondary | ICD-10-CM | POA: Diagnosis not present

## 2017-04-27 DIAGNOSIS — Z510 Encounter for antineoplastic radiation therapy: Secondary | ICD-10-CM | POA: Diagnosis not present

## 2017-04-27 DIAGNOSIS — Z170 Estrogen receptor positive status [ER+]: Secondary | ICD-10-CM | POA: Diagnosis not present

## 2017-04-27 DIAGNOSIS — C50411 Malignant neoplasm of upper-outer quadrant of right female breast: Secondary | ICD-10-CM | POA: Diagnosis not present

## 2017-04-30 DIAGNOSIS — E119 Type 2 diabetes mellitus without complications: Secondary | ICD-10-CM | POA: Diagnosis not present

## 2017-04-30 DIAGNOSIS — Z23 Encounter for immunization: Secondary | ICD-10-CM | POA: Diagnosis not present

## 2017-04-30 DIAGNOSIS — G4733 Obstructive sleep apnea (adult) (pediatric): Secondary | ICD-10-CM | POA: Diagnosis not present

## 2017-04-30 DIAGNOSIS — K760 Fatty (change of) liver, not elsewhere classified: Secondary | ICD-10-CM | POA: Diagnosis not present

## 2017-04-30 DIAGNOSIS — I10 Essential (primary) hypertension: Secondary | ICD-10-CM | POA: Diagnosis not present

## 2017-04-30 DIAGNOSIS — Z0000 Encounter for general adult medical examination without abnormal findings: Secondary | ICD-10-CM | POA: Diagnosis not present

## 2017-05-01 DIAGNOSIS — C50411 Malignant neoplasm of upper-outer quadrant of right female breast: Secondary | ICD-10-CM | POA: Diagnosis not present

## 2017-05-04 DIAGNOSIS — Z510 Encounter for antineoplastic radiation therapy: Secondary | ICD-10-CM | POA: Diagnosis not present

## 2017-05-04 DIAGNOSIS — C50411 Malignant neoplasm of upper-outer quadrant of right female breast: Secondary | ICD-10-CM | POA: Diagnosis not present

## 2017-05-05 DIAGNOSIS — C50411 Malignant neoplasm of upper-outer quadrant of right female breast: Secondary | ICD-10-CM | POA: Diagnosis not present

## 2017-05-05 DIAGNOSIS — Z510 Encounter for antineoplastic radiation therapy: Secondary | ICD-10-CM | POA: Diagnosis not present

## 2017-05-06 DIAGNOSIS — C50411 Malignant neoplasm of upper-outer quadrant of right female breast: Secondary | ICD-10-CM | POA: Diagnosis not present

## 2017-05-06 DIAGNOSIS — Z510 Encounter for antineoplastic radiation therapy: Secondary | ICD-10-CM | POA: Diagnosis not present

## 2017-05-07 DIAGNOSIS — C50411 Malignant neoplasm of upper-outer quadrant of right female breast: Secondary | ICD-10-CM | POA: Diagnosis not present

## 2017-05-07 DIAGNOSIS — Z510 Encounter for antineoplastic radiation therapy: Secondary | ICD-10-CM | POA: Diagnosis not present

## 2017-05-08 DIAGNOSIS — Z510 Encounter for antineoplastic radiation therapy: Secondary | ICD-10-CM | POA: Diagnosis not present

## 2017-05-08 DIAGNOSIS — C50411 Malignant neoplasm of upper-outer quadrant of right female breast: Secondary | ICD-10-CM | POA: Diagnosis not present

## 2017-05-11 DIAGNOSIS — Z510 Encounter for antineoplastic radiation therapy: Secondary | ICD-10-CM | POA: Diagnosis not present

## 2017-05-11 DIAGNOSIS — C50411 Malignant neoplasm of upper-outer quadrant of right female breast: Secondary | ICD-10-CM | POA: Diagnosis not present

## 2017-05-12 DIAGNOSIS — C50411 Malignant neoplasm of upper-outer quadrant of right female breast: Secondary | ICD-10-CM | POA: Diagnosis not present

## 2017-05-13 DIAGNOSIS — C50411 Malignant neoplasm of upper-outer quadrant of right female breast: Secondary | ICD-10-CM | POA: Diagnosis not present

## 2017-05-14 DIAGNOSIS — Z510 Encounter for antineoplastic radiation therapy: Secondary | ICD-10-CM | POA: Diagnosis not present

## 2017-05-14 DIAGNOSIS — C50411 Malignant neoplasm of upper-outer quadrant of right female breast: Secondary | ICD-10-CM | POA: Diagnosis not present

## 2017-05-15 DIAGNOSIS — Z510 Encounter for antineoplastic radiation therapy: Secondary | ICD-10-CM | POA: Diagnosis not present

## 2017-05-15 DIAGNOSIS — C50411 Malignant neoplasm of upper-outer quadrant of right female breast: Secondary | ICD-10-CM | POA: Diagnosis not present

## 2017-05-21 DIAGNOSIS — C50411 Malignant neoplasm of upper-outer quadrant of right female breast: Secondary | ICD-10-CM | POA: Diagnosis not present

## 2017-05-22 DIAGNOSIS — Z170 Estrogen receptor positive status [ER+]: Secondary | ICD-10-CM | POA: Diagnosis not present

## 2017-05-22 DIAGNOSIS — Z510 Encounter for antineoplastic radiation therapy: Secondary | ICD-10-CM | POA: Diagnosis not present

## 2017-05-22 DIAGNOSIS — C50411 Malignant neoplasm of upper-outer quadrant of right female breast: Secondary | ICD-10-CM | POA: Diagnosis not present

## 2017-05-25 DIAGNOSIS — C50411 Malignant neoplasm of upper-outer quadrant of right female breast: Secondary | ICD-10-CM | POA: Diagnosis not present

## 2017-06-04 DIAGNOSIS — L259 Unspecified contact dermatitis, unspecified cause: Secondary | ICD-10-CM | POA: Diagnosis not present

## 2017-06-08 NOTE — Progress Notes (Signed)
No show

## 2017-06-09 ENCOUNTER — Ambulatory Visit: Payer: PPO | Admitting: Oncology

## 2017-06-09 ENCOUNTER — Encounter: Payer: Self-pay | Admitting: Oncology

## 2017-06-09 ENCOUNTER — Other Ambulatory Visit: Payer: Self-pay | Admitting: Oncology

## 2017-06-09 ENCOUNTER — Other Ambulatory Visit: Payer: PPO

## 2017-06-09 NOTE — Progress Notes (Unsigned)
We received a copy of Diane Macias's bone density from [facility].  This showed an adult T score of +0.4, normal

## 2017-06-09 NOTE — Progress Notes (Signed)
[place]  [phone] [phone]     ID: [name] DOB: [birth year]  MR#: [id number]  ZSW#:[id number]  Patient Care Team: Drake Leach, MD as PCP - General (Internal Medicine) Angelina Ok, MD as Referring Physician (Surgery) Magrinat, Virgie Dad, MD as Consulting Physician (Oncology) Thea Silversmith, MD (Inactive) as Referring Physician (Radiation Oncology) Mauri Pole, MD as Consulting Physician (Gastroenterology) Chauncey Cruel, MD OTHER MD:  CHIEF COMPLAINT: Estrogen receptor positive lobular breast cancer  CURRENT TREATMENT: Adjuvant radiation pending   HISTORY OF CURRENT ILLNESS: Diane Macias had bilateral screening mammography at [facility] 11/30/2014 showing the breast density to be category B. There was a possible mass in the right breast. On 12/10/2016 she underwent right diagnostic mammography with tomography and right breast ultrasonography. This confirmed a 0.4 cm spiculated mass in the periareolar area of the right breast. Ultrasound showed a small irregular hypoechoic mass at the 9:00 position 1 cm from the nipple, measuring 0.5 cm. Ultrasound of the right axilla was benign.  Biopsy of the right breast area in question 12/25/2016 showed an invasive lobular carcinoma, grade 2, estrogen receptor positive at 85%, progesterone receptor positive at 65%, both with strong staining intensity, and HER-2 not amplified by immunohistochemistry (1+).   On 01/19/2017 she underwent bilateral breast MRIs which showed in addition to the known right breast mass an additional area of concern in the central right breast. This measured 0.6 cm. MRI guided biopsy 02/06/2017 confirmed that this second area also was invasive lobular carcinoma, grade 2. It was located (by MRI) 1.5 cm inferior medial to the previously biopsied mass. Also there was a 0.6 mm sternal lesion in the inferior aspect of the sternum.  Because of the sternal lesion on the  patient underwent PET scanning 02/12/2017. This was entirely negative and the sternal lesion is felt to be most consistent with a bone island  On 02/20/2017 the patient underwent right lumpectomy. This showed ([id number]) invasive lobular carcinoma measuring 0.5 cm, grade 2, with evidence of lobular carcinoma in situ. Margins were clear. Both sentinel lymph nodes were clear. [NB: the second biopsied lesion was present in the surgical sample but not addressed in the report; I have discussed this with pathology and Dr Catha Brow has kindly agreed to review the case, measure this lesion and margins, and dictate an addendum]  The patient's subsequent history is as detailed below.  INTERVAL HISTORY: Diane Macias was evaluated in the breast clinic 03/17/2017 accompanied by her husband Diane Macias and their daughter Diane Macias.   REVIEW OF SYSTEMS: There were no specific symptoms leading to the original mammogram, which was routinely scheduled. The patient denies unusual headaches, visual changes, nausea, vomiting, stiff neck, dizziness, or gait imbalance. There has been no cough, phlegm production, or pleurisy, no chest pain or pressure, and no change in bowel or bladder habits. The patient denies fever, rash, bleeding, unexplained fatigue or unexplained weight loss. [name] did well with the surgery. She does have some discomfort and tingling in the surgical breast which is benign unexpected. A detailed review of systems was otherwise entirely negative.   PAST MEDICAL HISTORY: Past Medical History:  Diagnosis Date  . Adenomatous colon polyp 04/18/2011  . Allergy   . Arthritis   . Cancer ([place]) 1997   squamous and basal cell  . Diabetes mellitus    type II  . Hyperlipidemia   . Hypertension   . Osteopenia   . Sleep apnea    CPAP    PAST SURGICAL HISTORY: Past  Surgical History:  Procedure Laterality Date  . arthroscopic knee  01/2008   right knee  . CATARACT EXTRACTION Left 2016  . CHOLECYSTECTOMY  1981  .  COLONOSCOPY    . FOOT SURGERY  07/2007   right foot (BUNION)  . KNEE ARTHROSCOPY     left  . TONSILLECTOMY  1953  . torn maniscus  11/2010   left knee  . VAGINAL HYSTERECTOMY  1994    FAMILY HISTORY Family History  Problem Relation Age of Onset  . Lung cancer Father 68  . Colon cancer Neg Hx   The patient's father died from lung cancer in the setting of tobacco abuse. He was 71 years old. The patient's mother died at 73 with Alzheimer's disease. The patient had no brothers, 2 sisters. One sister was diagnosed with breast cancer at age 29. There is also a maternal cousin with breast cancer. There is no history of ovarian or prostate cancer in the family. The patient was genetically tested at [facility] and reportedly carries a heterozygous MutYH mutation. I do have requested that report  GYNECOLOGIC HISTORY:  No LMP recorded. Patient has had a hysterectomy. Menarche age 68, first live birth age 62, the patient is [name] P2. She underwent total abdominal hysterectomy with bilateral salpingo-oophorectomy approximately age 56. She took hormone replacement for approximately 17 years.  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 Daughter Diane Macias is a Risk manager in [place]. Daughter Diane Macias is Web designer at the country day [employer] locally. The patient has 3 grandchildren. She is a Tourist information centre manager.    ADVANCED DIRECTIVES:    HEALTH MAINTENANCE: Social History   Tobacco Use  . Smoking status: Former Smoker    Last attempt to quit: 07/21/1968    Years since quitting: 48.9  . Smokeless tobacco: Never Used  Substance Use Topics  . Alcohol use: Yes    Comment: rarely wine  . Drug use: No     Colonoscopy: May 2018; Nandigam  PAP:  Bone density:   Allergies  Allergen Reactions  . Ciprofloxacin Swelling and Rash    other  . Flagyl [Metronidazole] Swelling  . Celecoxib Swelling  . Diazepam Other (See Comments)     dpression  . Hydrocodone Other (See Comments)    Could not sleep  . Oxycodone Other (See Comments)    Restless  . Sulfa Antibiotics Rash  . Sulfonamide Derivatives Rash    Current Outpatient Medications  Medication Sig Dispense Refill  . aspirin 81 MG tablet Take 81 mg by mouth 2 (two) times a week.      . cholecalciferol (VITAMIN D) 1000 UNITS tablet Take 1,000 Units by mouth daily.      Marland Kitchen glucose blood test strip 1 each by Other route as needed for other. Use as instructed    . Ibuprofen (ADVIL) 200 MG CAPS Take 2 capsules by mouth as needed.    Marland Kitchen lisinopril (PRINIVIL,ZESTRIL) 10 MG tablet Take 10 mg by mouth daily.      . metFORMIN (GLUCOPHAGE) 500 MG tablet Take 500 mg by mouth daily after supper.      . ONE TOUCH LANCETS MISC by Does not apply route.    . pravastatin (PRAVACHOL) 10 MG tablet Take 10 mg by mouth daily.      . Probiotic Product (ALIGN PO) Take 1 capsule by mouth daily.    . vitamin B-12 (CYANOCOBALAMIN) 1000 MCG tablet Take 1,000 mcg  by mouth daily.     Current Facility-Administered Medications  Medication Dose Route Frequency Provider Last Rate Last Dose  . 0.9 %  sodium chloride infusion  500 mL Intravenous Continuous Nandigam, Kavitha V, MD        OBJECTIVE: Middle-aged white woman in no acute distress  There were no vitals filed for this visit.   There is no height or weight on file to calculate BMI.   Wt Readings from Last 3 Encounters:  03/17/17 205 lb 4.8 oz (93.1 kg)  11/19/16 204 lb (92.5 kg)  11/05/16 204 lb (92.5 kg)      ECOG FS:0 - Asymptomatic  Ocular: Sclerae unicteric, pupils round and equal Ear-nose-throat: Oropharynx clear and moist Lymphatic: No cervical or supraclavicular adenopathy Lungs no rales or rhonchi Heart regular rate and rhythm Abd soft, nontender, positive bowel sounds MSK no focal spinal tenderness, no joint edema Neuro: non-focal, well-oriented, appropriate affect Breasts: The right breast is status post recent  lumpectomy. The inferior is on the inframammary side and Steri-Strips are still in place. There is no dehiscence, erythema, or swelling. The cosmetic result is excellent. The left breast is unremarkable. Both axillae are benign.   LAB RESULTS:  CMP     Component Value Date/Time   NA 139 03/17/2017 1524   K 4.2 03/17/2017 1524   CO2 23 03/17/2017 1524   GLUCOSE 102 03/17/2017 1524   BUN 28.6 (H) 03/17/2017 1524   CREATININE 0.9 03/17/2017 1524   CALCIUM 9.8 03/17/2017 1524   PROT 6.8 03/17/2017 1524   ALBUMIN 3.8 03/17/2017 1524   AST 15 03/17/2017 1524   ALT 16 03/17/2017 1524   ALKPHOS 88 03/17/2017 1524   BILITOT 0.31 03/17/2017 1524    No results found for: TOTALPROTELP, ALBUMINELP, A1GS, A2GS, BETS, BETA2SER, GAMS, MSPIKE, SPEI  No results found for: Nils Pyle, KAPLAMBRATIO  Lab Results  Component Value Date   WBC 8.6 03/17/2017   NEUTROABS 4.9 03/17/2017   HGB 11.5 (L) 03/17/2017   HCT 34.6 (L) 03/17/2017   MCV 90.9 03/17/2017   PLT 172 03/17/2017      Chemistry      Component Value Date/Time   NA 139 03/17/2017 1524   K 4.2 03/17/2017 1524   CO2 23 03/17/2017 1524   BUN 28.6 (H) 03/17/2017 1524   CREATININE 0.9 03/17/2017 1524      Component Value Date/Time   CALCIUM 9.8 03/17/2017 1524   ALKPHOS 88 03/17/2017 1524   AST 15 03/17/2017 1524   ALT 16 03/17/2017 1524   BILITOT 0.31 03/17/2017 1524       No results found for: LABCA2  No components found for: OIZTIW580  No results for input(s): INR in the last 168 hours.  No results found for: LABCA2  No results found for: DXI338  No results found for: SNK539  No results found for: JQB341  No results found for: CA2729  No components found for: HGQUANT  No results found for: CEA1 / No results found for: CEA1   No results found for: AFPTUMOR  No results found for: CHROMOGRNA  No results found for: PSA1  No visits with results within 3 Day(s) from this visit.  Latest known  visit with results is:  Appointment on 03/17/2017  Component Date Value Ref Range Status  . WBC 03/17/2017 8.6  3.9 - 10.3 10e3/uL Final  . NEUT# 03/17/2017 4.9  1.5 - 6.5 10e3/uL Final  . HGB 03/17/2017 11.5* 11.6 - 15.9 g/dL Final  .  HCT 03/17/2017 34.6* 34.8 - 46.6 % Final  . Platelets 03/17/2017 172  145 - 400 10e3/uL Final  . MCV 03/17/2017 90.9  79.5 - 101.0 fL Final  . MCH 03/17/2017 30.2  25.1 - 34.0 pg Final  . MCHC 03/17/2017 33.2  31.5 - 36.0 g/dL Final  . RBC 03/17/2017 3.81  3.70 - 5.45 10e6/uL Final  . RDW 03/17/2017 13.6  11.2 - 14.5 % Final  . lymph# 03/17/2017 2.9  0.9 - 3.3 10e3/uL Final  . MONO# 03/17/2017 0.6  0.1 - 0.9 10e3/uL Final  . Eosinophils Absolute 03/17/2017 0.1  0.0 - 0.5 10e3/uL Final  . Basophils Absolute 03/17/2017 0.1  0.0 - 0.1 10e3/uL Final  . NEUT% 03/17/2017 56.9  38.4 - 76.8 % Final  . LYMPH% 03/17/2017 34.1  14.0 - 49.7 % Final  . MONO% 03/17/2017 6.8  0.0 - 14.0 % Final  . EOS% 03/17/2017 1.4  0.0 - 7.0 % Final  . BASO% 03/17/2017 0.8  0.0 - 2.0 % Final  . Sodium 03/17/2017 139  136 - 145 mEq/L Final  . Potassium 03/17/2017 4.2  3.5 - 5.1 mEq/L Final  . Chloride 03/17/2017 109  98 - 109 mEq/L Final  . CO2 03/17/2017 23  22 - 29 mEq/L Final  . Glucose 03/17/2017 102  70 - 140 mg/dl Final   Glucose reference range is for nonfasting patients. Fasting glucose reference range is 70- 100.  Marland Kitchen BUN 03/17/2017 28.6* 7.0 - 26.0 mg/dL Final  . Creatinine 03/17/2017 0.9  0.6 - 1.1 mg/dL Final  . Total Bilirubin 03/17/2017 0.31  0.20 - 1.20 mg/dL Final  . Alkaline Phosphatase 03/17/2017 88  40 - 150 U/L Final  . AST 03/17/2017 15  5 - 34 U/L Final  . ALT 03/17/2017 16  0 - 55 U/L Final  . Total Protein 03/17/2017 6.8  6.4 - 8.3 g/dL Final  . Albumin 03/17/2017 3.8  3.5 - 5.0 g/dL Final  . Calcium 03/17/2017 9.8  8.4 - 10.4 mg/dL Final  . Anion Gap 03/17/2017 7  3 - 11 mEq/L Final  . EGFR 03/17/2017 67* >90 ml/min/1.73 m2 Final   eGFR is calculated  using the CKD-EPI Creatinine Equation (2009)  . Extra Tube 03/17/2017 Sample held in lab for 7 days   Final    (this displays the last labs from the last 3 days)  No results found for: TOTALPROTELP, ALBUMINELP, A1GS, A2GS, BETS, BETA2SER, GAMS, MSPIKE, SPEI (this displays SPEP labs)  No results found for: KPAFRELGTCHN, LAMBDASER, KAPLAMBRATIO (kappa/lambda light chains)  No results found for: HGBA, HGBA2QUANT, HGBFQUANT, HGBSQUAN (Hemoglobinopathy evaluation)   No results found for: LDH  No results found for: IRON, TIBC, IRONPCTSAT (Iron and TIBC)  No results found for: FERRITIN  Urinalysis No results found for: COLORURINE, APPEARANCEUR, LABSPEC, PHURINE, GLUCOSEU, HGBUR, BILIRUBINUR, KETONESUR, PROTEINUR, UROBILINOGEN, NITRITE, LEUKOCYTESUR   STUDIES: Outside studies reviewed with the patient  ELIGIBLE FOR AVAILABLE RESEARCH PROTOCOL: no  ASSESSMENT: 71 y.o. [place], [place] woman status post right lumpectomy 02/20/2017 for 2 separate areas of invasive lobular carcinoma, grade 2, only one addressed in the pathology report, that one being pT1a pN0, stage IA, estrogen and progesterone receptor strongly positive, HER-2 not amplified  (a) the second area of known invasive lobular carcinoma was also included in the surgical sample; its dimensions and margins are to be addressed in a pathology addendum  (1) unless there is a surprise in the pathology addendum, no Oncotype is needed and no chemotherapy is  warranted  (2) adjuvant radiation pending  (3) to start anti-estrogens at the conclusion of local therapy  (4) per patient, she carries a heterozygous MutYH mutation; genetics report has been requested  PLAN: We spent the better part of today's hour-long appointment discussing the biology of her diagnosis and the specifics of her situation. We first reviewed the fact that cancer is not one disease but more than 100 different diseases and that it is important to keep them separate--  otherwise when friends and relatives discuss their own cancer experiences with [facility] confusion can result. Similarly we explained that if breast cancer spreads to the bone or liver, the patient would not have bone cancer or liver cancer, but breast cancer in the bone and breast cancer in the liver: one cancer in three places-- not 3 different cancers which otherwise would have to be treated in 3 different ways.  We discussed the difference between local and systemic therapy. In terms of loco-regional treatment, she has already had her surgery and is anticipating adjuvant radiation.  Next we went over the options for systemic therapy which are anti-estrogens, anti-HER-2 immunotherapy, and chemotherapy. Aelyn does not meet criteria for anti-HER-2 immunotherapy. She is a good candidate for anti-estrogens.  The question of chemotherapy is more complicated. Chemotherapy is most effective in rapidly growing, aggressive tumors. Of course it is also indicated in locally advanced disease. Neither of those situations are the case here. Further, we know from neoadjuvant treatments that lobular breast cancers tend to respond less briskly to chemotherapy then ductal. Finally, neither Oncotype nor chemotherapy are recommended in NCCN guidelines for T1a estrogen receptor positive breast cancers.  In brief, Alaysha has a very good long-term prognosis without chemotherapy and adding chemotherapy would not make that prognosis better.  Accordingly, pending final pathology review, I feel comfortable bypassing the Oncotype test, avoiding chemotherapy, and moving directly to radiation. I anticipate this will be started within the next 2 or 3 weeks and that it will take 6-1/2 weeks once started. Accordingly I am scheduling married to return to see me in mid November. At that point we will review her options for anti-estrogen treatment and she will be started on her 5 years of systemic therapy.  Rasheedah has a good understanding of the  overall plan. She agrees with it. She knows the goal of treatment in her case is cure. She will call with any problems that may develop before her next visit here.  Chauncey Cruel, MD   06/09/2017 8:02 AM Medical Oncology and Hematology [facility] [address] [place], [place] [address] Tel. [phone]    Fax. [phone] Okay that that sounds. GU one of the 22 internal order can you but that it

## 2017-06-10 ENCOUNTER — Other Ambulatory Visit: Payer: Self-pay

## 2017-06-10 ENCOUNTER — Telehealth: Payer: Self-pay | Admitting: Gastroenterology

## 2017-06-10 DIAGNOSIS — D485 Neoplasm of uncertain behavior of skin: Secondary | ICD-10-CM | POA: Diagnosis not present

## 2017-06-10 DIAGNOSIS — R21 Rash and other nonspecific skin eruption: Secondary | ICD-10-CM | POA: Diagnosis not present

## 2017-06-10 MED ORDER — POLYETHYLENE GLYCOL 3350 17 GM/SCOOP PO POWD: ORAL | 0 refills | Status: DC

## 2017-06-10 MED ORDER — AMOXICILLIN-POT CLAVULANATE 875-125 MG PO TABS: 1.0000 | ORAL_TABLET | Freq: Two times a day (BID) | ORAL | 0 refills | Status: DC

## 2017-06-10 NOTE — Telephone Encounter (Signed)
Patient agrees to this plan. °

## 2017-06-10 NOTE — Telephone Encounter (Signed)
Reports 3 day history of left low abdominal discomfort and pulling sensation. Feels constipated but her bowels are moving a little. Temp 99.2 last night and felt "chills." No fever today. Denies nausea or vomiting. Seen by dermatology today for radiation rash on her chest. She will be taking Decadron per patient. Allergic to Cipro and Flagyl.

## 2017-06-10 NOTE — Telephone Encounter (Signed)
Advise patient to do clear liquid diet and bowel purge with Miralax. Send Rx for augmentin 875mg  BID X7 days to take only if abdominal pain doesn't improve with bowel purge. Call back with any worsening symptoms. Thanks

## 2017-06-17 DIAGNOSIS — L280 Lichen simplex chronicus: Secondary | ICD-10-CM | POA: Diagnosis not present

## 2017-06-22 ENCOUNTER — Telehealth: Payer: Self-pay | Admitting: Oncology

## 2017-06-22 DIAGNOSIS — Z170 Estrogen receptor positive status [ER+]: Secondary | ICD-10-CM | POA: Diagnosis not present

## 2017-06-22 DIAGNOSIS — C50411 Malignant neoplasm of upper-outer quadrant of right female breast: Secondary | ICD-10-CM | POA: Diagnosis not present

## 2017-06-22 NOTE — Telephone Encounter (Signed)
Patient called in to r/s a mised appt from 11/20 - patient is aware of new appt date and time and reminder letter sent in the mail,.

## 2017-07-24 NOTE — Progress Notes (Signed)
[place]  [phone] [phone]     ID: Diane Macias DOB: 09/07/1945  MR#: [id number]  SNK#:[id number]  Patient Care Team: Drake Leach, MD as PCP - General (Internal Medicine) Angelina Ok, MD as Referring Physician (Surgery) Magrinat, Virgie Dad, MD as Consulting Physician (Oncology) Thea Silversmith, MD (Inactive) as Referring Physician (Radiation Oncology) Mauri Pole, MD as Consulting Physician (Gastroenterology) OTHER MD:  CHIEF COMPLAINT: Estrogen receptor positive lobular breast cancer   CURRENT TREATMENT: Anastrozole   HISTORY OF CURRENT ILLNESS: From the original intake note:  Diane Macias had bilateral screening mammography at [facility] 11/30/2014 showing the breast density to be category B. There was a possible mass in the right breast. On 12/10/2016 she underwent right diagnostic mammography with tomography and right breast ultrasonography. This confirmed a 0.4 cm spiculated mass in the periareolar area of the right breast. Ultrasound showed a small irregular hypoechoic mass at the 9:00 position 1 cm from the nipple, measuring 0.5 cm. Ultrasound of the right axilla was benign.  Biopsy of the right breast area in question 12/25/2016 showed an invasive lobular carcinoma, grade 2, estrogen receptor positive at 85%, progesterone receptor positive at 65%, both with strong staining intensity, and HER-2 not amplified by immunohistochemistry (1+).   On 01/19/2017 she underwent bilateral breast MRIs which showed in addition to the known right breast mass an additional area of concern in the central right breast. This measured 0.6 cm. MRI guided biopsy 02/06/2017 confirmed that this second area also was invasive lobular carcinoma, grade 2. It was located (by MRI) 1.5 cm inferior medial to the previously biopsied mass. Also there was a 0.6 mm sternal lesion in the inferior aspect of the sternum.  Because of the sternal lesion on the  patient underwent PET scanning 02/12/2017. This was entirely negative and the sternal lesion is felt to be most consistent with a bone island  On 02/20/2017 the patient underwent right lumpectomy. This showed ([id number]) invasive lobular carcinoma measuring 0.5 cm, grade 2, with evidence of lobular carcinoma in situ. Margins were clear. Both sentinel lymph nodes were clear. [NB: the second biopsied lesion was present in the surgical sample but not addressed in the report; I have discussed this with pathology and Dr Catha Brow has kindly agreed to review the case, measure this lesion and margins, and dictate an addendum]  The patient's subsequent history is as detailed below.  INTERVAL HISTORY: Diane Macias returns today for follow up and treatment of her estrogen receptor positive lobular breast cancer accompanied by her husband. She notes that she completed radiation therapy on 05/25/2017. She notes that she did not tolerate it well because she had a skin reaction on her chest that was not directly targeted by the radiation.  This was felt to be reactive and cleared with steroids   REVIEW OF SYSTEMS: Lumen reports that due to her adverse reaction to radiation, she followed up with Dr. Clement Husbands B. Hollar at [facility]. She received steroids to aid the rash on her chest. She also followed up with radiation oncology on December 5th with Dr. Pablo Ledger. She notes that she had a total of 16 treatments, and she may have been allergic to 2 of her medications while completing raditation. She notes that she was able to continue working full time. She reports that she didn't feel fatigued from treatments. She notes that she stayed busy during the holidays with going to church and visiting her grandchildren. She also completed aerobics exercises this past weekend.  She denies unusual headaches, visual changes, nausea, vomiting, or dizziness. There has been no unusual cough, phlegm production, or pleurisy. This  been no change in bowel or bladder habits. She denies unexplained fatigue or unexplained weight loss, bleeding, rash, or fever. A detailed review of systems was otherwise stable.    PAST MEDICAL HISTORY: Past Medical History:  Diagnosis Date  . Adenomatous colon polyp 04/18/2011  . Allergy   . Arthritis   . Cancer ([place]) 1997   squamous and basal cell  . Diabetes mellitus    type II  . Hyperlipidemia   . Hypertension   . Osteopenia   . Sleep apnea    CPAP    PAST SURGICAL HISTORY: Past Surgical History:  Procedure Laterality Date  . arthroscopic knee  01/2008   right knee  . CATARACT EXTRACTION Left 2016  . CHOLECYSTECTOMY  1981  . COLONOSCOPY    . FOOT SURGERY  07/2007   right foot (BUNION)  . KNEE ARTHROSCOPY     left  . TONSILLECTOMY  1953  . torn maniscus  11/2010   left knee  . VAGINAL HYSTERECTOMY  1994    FAMILY HISTORY Family History  Problem Relation Age of Onset  . Lung cancer Father 61  . Colon cancer Neg Hx   The patient's father died from lung cancer in the setting of tobacco abuse. He was 72 years old. The patient's mother died at 19 with Alzheimer's disease. The patient had no brothers, 2 sisters. One sister was diagnosed with breast cancer at age 2. There is also a maternal cousin with breast cancer. There is no history of ovarian or prostate cancer in the family. The patient was genetically [facility] and reportedly carries a heterozygous MutYH mutation. I do have requested that report  GYNECOLOGIC HISTORY:  No LMP recorded. Patient has had a hysterectomy. Menarche age 51, first live birth age 28, the patient is [place] P2. She underwent total abdominal hysterectomy with bilateral salpingo-oophorectomy approximately age 72. She took hormone replacement for approximately 17 years.  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 [name] is a retired Scientist, water quality. Daughter [name] is a Risk manager in [place]. Daughter  [name] is Web designer at the country day school locally. The patient has 3 grandchildren. She is a Tourist information centre manager.    ADVANCED DIRECTIVES:    HEALTH MAINTENANCE: Social History   Tobacco Use  . Smoking status: Former Smoker    Last attempt to quit: 07/21/1968    Years since quitting: 49.0  . Smokeless tobacco: Never Used  Substance Use Topics  . Alcohol use: Yes    Comment: rarely wine  . Drug use: No     Colonoscopy: May 2018; Nandigam  PAP:  Bone density:   Allergies  Allergen Reactions  . Ciprofloxacin Swelling and Rash    other  . Flagyl [Metronidazole] Swelling  . Celecoxib Swelling  . Diazepam Other (See Comments)    dpression  . Hydrocodone Other (See Comments)    Could not sleep  . Oxycodone Other (See Comments)    Restless  . Sulfa Antibiotics Rash  . Sulfonamide Derivatives Rash    Current Outpatient Medications  Medication Sig Dispense Refill  . amoxicillin-clavulanate (AUGMENTIN) 875-125 MG tablet Take 1 tablet by mouth 2 (two) times daily. 14 tablet 0  . aspirin 81 MG tablet Take 81 mg by mouth 2 (two) times a week.      . cholecalciferol (VITAMIN  D) 1000 UNITS tablet Take 1,000 Units by mouth daily.      Marland Kitchen glucose blood test strip 1 each by Other route as needed for other. Use as instructed    . Ibuprofen (ADVIL) 200 MG CAPS Take 2 capsules by mouth as needed.    Marland Kitchen lisinopril (PRINIVIL,ZESTRIL) 10 MG tablet Take 10 mg by mouth daily.      . metFORMIN (GLUCOPHAGE) 500 MG tablet Take 500 mg by mouth daily after supper.      . ONE TOUCH LANCETS MISC by Does not apply route.    . polyethylene glycol powder (GLYCOLAX/MIRALAX) powder Take 1 dose in 8oz of fluid x 7 255 g 0  . pravastatin (PRAVACHOL) 10 MG tablet Take 10 mg by mouth daily.      . Probiotic Product (ALIGN PO) Take 1 capsule by mouth daily.    . vitamin B-12 (CYANOCOBALAMIN) 1000 MCG tablet Take 1,000 mcg by mouth daily.     Current Facility-Administered Medications  Medication Dose  Route Frequency Provider Last Rate Last Dose  . 0.9 %  sodium chloride infusion  500 mL Intravenous Continuous Nandigam, Venia Minks, MD        OBJECTIVE: Middle-aged white woman who appears well  Vitals:   07/27/17 1331  BP: (!) 156/75  Pulse: 77  Resp: 18  Temp: (!) 97.4 F (36.3 C)  SpO2: 96%     Body mass index is 37.86 kg/m.   Wt Readings from Last 3 Encounters:  07/27/17 207 lb (93.9 kg)  03/17/17 205 lb 4.8 oz (93.1 kg)  11/19/16 204 lb (92.5 kg)      ECOG FS:0 - Asymptomatic  Sclerae unicteric, EOMs intact Oropharynx clear and moist No cervical or supraclavicular adenopathy Lungs no rales or rhonchi Heart regular rate and rhythm Abd soft, nontender, positive bowel sounds MSK no focal spinal tenderness, no upper extremity lymphedema Neuro: nonfocal, well oriented, appropriate affect Breasts: Right breast is status post lumpectomy and radiation.  There is very minimal hyperpigmentation.  The cosmetic result is good.  There is no evidence of local recurrence.  The left breast is benign.  Both axillae are benign.  LAB RESULTS:  CMP     Component Value Date/Time   NA 139 03/17/2017 1524   K 4.2 03/17/2017 1524   CO2 23 03/17/2017 1524   GLUCOSE 102 03/17/2017 1524   BUN 28.6 (H) 03/17/2017 1524   CREATININE 0.9 03/17/2017 1524   CALCIUM 9.8 03/17/2017 1524   PROT 6.8 03/17/2017 1524   ALBUMIN 3.8 03/17/2017 1524   AST 15 03/17/2017 1524   ALT 16 03/17/2017 1524   ALKPHOS 88 03/17/2017 1524   BILITOT 0.31 03/17/2017 1524    No results found for: TOTALPROTELP, ALBUMINELP, A1GS, A2GS, BETS, BETA2SER, GAMS, MSPIKE, SPEI  No results found for: Nils Pyle, [facility]  Lab Results  Component Value Date   WBC 7.1 07/27/2017   NEUTROABS 4.3 07/27/2017   HGB 12.3 07/27/2017   HCT 37.2 07/27/2017   MCV 92.1 07/27/2017   PLT 177 07/27/2017      Chemistry      Component Value Date/Time   NA 139 03/17/2017 1524   K 4.2 03/17/2017 1524   CO2  23 03/17/2017 1524   BUN 28.6 (H) 03/17/2017 1524   CREATININE 0.9 03/17/2017 1524      Component Value Date/Time   CALCIUM 9.8 03/17/2017 1524   ALKPHOS 88 03/17/2017 1524   AST 15 03/17/2017 1524   ALT 16 03/17/2017 1524  BILITOT 0.31 03/17/2017 1524       No results found for: LABCA2  No components found for: CXKGYJ856  No results for input(s): INR in the last 168 hours.  No results found for: LABCA2  No results found for: DJS970  No results found for: YOV785  No results found for: YIF027  No results found for: CA2729  No components found for: HGQUANT  No results found for: CEA1 / No results found for: CEA1   No results found for: AFPTUMOR  No results found for: CHROMOGRNA  No results found for: PSA1  Appointment on 07/27/2017  Component Date Value Ref Range Status  . WBC 07/27/2017 7.1  3.9 - 10.3 K/uL Final  . RBC 07/27/2017 4.04  3.70 - 5.45 MIL/uL Final  . Hemoglobin 07/27/2017 12.3  11.6 - 15.9 g/dL Final  . HCT 07/27/2017 37.2  34.8 - 46.6 % Final  . MCV 07/27/2017 92.1  79.5 - 101.0 fL Final  . MCH 07/27/2017 30.4  25.1 - 34.0 pg Final  . MCHC 07/27/2017 33.1  31.5 - 36.0 g/dL Final  . RDW 07/27/2017 13.9  11.2 - 16.1 % Final  . Platelets 07/27/2017 177  145 - 400 K/uL Final  . Neutrophils Relative % 07/27/2017 62  % Final  . Neutro Abs 07/27/2017 4.3  1.5 - 6.5 K/uL Final  . Abs Granulocyte 07/27/2017 4.3  1.5 - 6.5 K/uL Final  . Lymphocytes Relative 07/27/2017 32  % Final  . Lymphs Abs 07/27/2017 2.2  0.9 - 3.3 K/uL Final  . Monocytes Relative 07/27/2017 5  % Final  . Monocytes Absolute 07/27/2017 0.4  0.1 - 0.9 K/uL Final  . Eosinophils Relative 07/27/2017 1  % Final  . Eosinophils Absolute 07/27/2017 0.1  0.0 - 0.5 K/uL Final  . Basophils Relative 07/27/2017 0  % Final  . Basophils Absolute 07/27/2017 0.0  0.0 - 0.1 K/uL Final   Performed at [facility], [place] [address], [place], [place] [address]    (this  displays the last labs from the last 3 days)  No results found for: TOTALPROTELP, ALBUMINELP, A1GS, A2GS, BETS, BETA2SER, GAMS, MSPIKE, SPEI (this displays SPEP labs)  No results found for: KPAFRELGTCHN, LAMBDASER, KAPLAMBRATIO (kappa/lambda light chains)  No results found for: HGBA, HGBA2QUANT, HGBFQUANT, HGBSQUAN (Hemoglobinopathy evaluation)   No results found for: LDH  No results found for: IRON, TIBC, IRONPCTSAT (Iron and TIBC)  No results found for: FERRITIN  Urinalysis No results found for: COLORURINE, APPEARANCEUR, LABSPEC, PHURINE, GLUCOSEU, HGBUR, BILIRUBINUR, KETONESUR, PROTEINUR, UROBILINOGEN, NITRITE, LEUKOCYTESUR   STUDIES: Recall she had a PET scan at [facility] 02/12/2017 showing no avid masses or frequent ancillary findings   ELIGIBLE FOR AVAILABLE RESEARCH PROTOCOL: no  ASSESSMENT: 72 y.o. [place], [place] woman status post right lumpectomy 02/20/2017 for 2 separate areas of invasive lobular carcinoma, grade 2, only one addressed in the pathology report, that one being pT1a pN0, stage IA, estrogen and progesterone receptor strongly positive, HER-2 not amplified  (a) the second area of known invasive lobular carcinoma was also included in the surgical sample; its dimensions and margins are to be addressed in a pathology addendum  (1) no Oncotype not needed and no chemotherapy is warranted  (2) adjuvant radiation4256 to the right breast completed 05/25/17  (3) anastrozole started 07/27/2017  (a) bone density at [facility] shows a T score of +0.4  (4) per patient, she carries a heterozygous MutYH mutation; genetics report has been requested  PLAN: Criss has  completed her local treatment and now is ready to consider anti-estrogens for systemic therapy. We discussed the difference between tamoxifen and anastrozole in detail. She understands that anastrozole and the aromatase inhibitors in general work by blocking estrogen production. Accordingly vaginal dryness, decrease  in bone density, and of course hot flashes can result. The aromatase inhibitors can also negatively affect the cholesterol profile, although that is a minor effect. One out of 5 women on aromatase inhibitors we will feel "old and achy". This arthralgia/myalgia syndrome, which resembles fibromyalgia clinically, does resolve with stopping the medications. Accordingly this is not a reason to not try an aromatase inhibitor but it is a frequent reason to stop it (in other words 20% of women will not be able to tolerate these medications).  Tamoxifen on the other hand does not block estrogen production. It does not "take away a woman's estrogen". It blocks the estrogen receptor in breast cells. Like anastrozole, it can also cause hot flashes. As opposed to anastrozole, tamoxifen has many estrogen-like effects. It is technically an estrogen receptor modulator. This means that in some tissues tamoxifen works like estrogen-- for example it helps strengthen the bones. It tends to improve the cholesterol profile. It can cause thickening of the endometrial lining, and even endometrial polyps or rarely cancer of the uterus.(The risk of uterine cancer due to tamoxifen is one additional cancer per thousand women year). It can cause vaginal wetness or stickiness. It can cause blood clots through this estrogen-like effect--the risk of blood clots with tamoxifen is exactly the same as with birth control pills or hormone replacement.  Neither of these agents causes mood changes or weight gain, despite the popular belief that they can have these side effects. We have data from studies comparing either of these drugs with placebo, and in those cases the control group had the same amount of weight gain and depression as the group that took the drug.  After much discussion, even though she is status post hysterectomy, she would prefer anastrozole chiefly because of concerns regarding blood clots and because of the slight advantage  on a head-to-head comparison between aromatase inhibitors and tamoxifen.  Accordingly she will start that today.  She will return to see me in about 2 months.  Assuming she tolerates anastrozole well the plan will be to continue that for a total of 5 years.  She knows to call for any other issues that may develop before her next    Magrinat, Virgie Dad, MD  07/27/17 1:53 PM Medical Oncology and Hematology [facility] [place], [place] [address] Tel. [phone]    Fax. [phone]  This document serves as a record of services personally performed by Lurline Del, MD. It was created on his behalf by Sheron Nightingale, a trained medical scribe. The creation of this record is based on the scribe's personal observations and the provider's statements to them.   I have reviewed the above documentation for accuracy and completeness, and I agree with the above.

## 2017-07-27 ENCOUNTER — Telehealth: Payer: Self-pay | Admitting: Oncology

## 2017-07-27 ENCOUNTER — Inpatient Hospital Stay: Payer: PPO | Attending: Oncology | Admitting: Oncology

## 2017-07-27 ENCOUNTER — Inpatient Hospital Stay: Payer: PPO

## 2017-07-27 VITALS — BP 156/75 | HR 77 | Temp 97.4°F | Resp 18 | Ht 62.0 in | Wt 207.0 lb

## 2017-07-27 DIAGNOSIS — Z79811 Long term (current) use of aromatase inhibitors: Secondary | ICD-10-CM | POA: Insufficient documentation

## 2017-07-27 DIAGNOSIS — Z79899 Other long term (current) drug therapy: Secondary | ICD-10-CM | POA: Insufficient documentation

## 2017-07-27 DIAGNOSIS — Z801 Family history of malignant neoplasm of trachea, bronchus and lung: Secondary | ICD-10-CM | POA: Insufficient documentation

## 2017-07-27 DIAGNOSIS — Z9049 Acquired absence of other specified parts of digestive tract: Secondary | ICD-10-CM | POA: Diagnosis not present

## 2017-07-27 DIAGNOSIS — Z882 Allergy status to sulfonamides status: Secondary | ICD-10-CM | POA: Diagnosis not present

## 2017-07-27 DIAGNOSIS — D126 Benign neoplasm of colon, unspecified: Secondary | ICD-10-CM | POA: Diagnosis not present

## 2017-07-27 DIAGNOSIS — M1990 Unspecified osteoarthritis, unspecified site: Secondary | ICD-10-CM | POA: Insufficient documentation

## 2017-07-27 DIAGNOSIS — Z7984 Long term (current) use of oral hypoglycemic drugs: Secondary | ICD-10-CM | POA: Diagnosis not present

## 2017-07-27 DIAGNOSIS — E785 Hyperlipidemia, unspecified: Secondary | ICD-10-CM | POA: Diagnosis not present

## 2017-07-27 DIAGNOSIS — I10 Essential (primary) hypertension: Secondary | ICD-10-CM | POA: Insufficient documentation

## 2017-07-27 DIAGNOSIS — Z170 Estrogen receptor positive status [ER+]: Secondary | ICD-10-CM | POA: Diagnosis not present

## 2017-07-27 DIAGNOSIS — G4730 Sleep apnea, unspecified: Secondary | ICD-10-CM | POA: Insufficient documentation

## 2017-07-27 DIAGNOSIS — E119 Type 2 diabetes mellitus without complications: Secondary | ICD-10-CM

## 2017-07-27 DIAGNOSIS — Z923 Personal history of irradiation: Secondary | ICD-10-CM | POA: Insufficient documentation

## 2017-07-27 DIAGNOSIS — M8580 Other specified disorders of bone density and structure, unspecified site: Secondary | ICD-10-CM | POA: Diagnosis not present

## 2017-07-27 DIAGNOSIS — Z87891 Personal history of nicotine dependence: Secondary | ICD-10-CM | POA: Insufficient documentation

## 2017-07-27 DIAGNOSIS — C50811 Malignant neoplasm of overlapping sites of right female breast: Secondary | ICD-10-CM

## 2017-07-27 DIAGNOSIS — Z881 Allergy status to other antibiotic agents status: Secondary | ICD-10-CM | POA: Insufficient documentation

## 2017-07-27 DIAGNOSIS — Z800 Family history of malignant neoplasm of digestive organs: Secondary | ICD-10-CM | POA: Insufficient documentation

## 2017-07-27 DIAGNOSIS — Z85828 Personal history of other malignant neoplasm of skin: Secondary | ICD-10-CM | POA: Diagnosis not present

## 2017-07-27 DIAGNOSIS — Z7982 Long term (current) use of aspirin: Secondary | ICD-10-CM | POA: Insufficient documentation

## 2017-07-27 DIAGNOSIS — Z90710 Acquired absence of both cervix and uterus: Secondary | ICD-10-CM | POA: Diagnosis not present

## 2017-07-27 LAB — COMPREHENSIVE METABOLIC PANEL
ALT: 22 U/L (ref 0–55)
ANION GAP: 8 (ref 3–11)
AST: 17 U/L (ref 5–34)
Albumin: 4 g/dL (ref 3.5–5.0)
Alkaline Phosphatase: 83 U/L (ref 40–150)
BUN: 25 mg/dL (ref 7–26)
CHLORIDE: 110 mmol/L — AB (ref 98–109)
CO2: 20 mmol/L — ABNORMAL LOW (ref 22–29)
CREATININE: 0.88 mg/dL (ref 0.60–1.10)
Calcium: 9.4 mg/dL (ref 8.4–10.4)
GFR calc non Af Amer: >60 mL/min (ref >60)
Glucose, Bld: 124 mg/dL (ref 70–140)
POTASSIUM: 4 mmol/L (ref 3.3–4.7)
Sodium: 138 mmol/L (ref 136–145)
Total Bilirubin: 0.4 mg/dL (ref 0.2–1.2)
Total Protein: 6.8 g/dL (ref 6.4–8.3)

## 2017-07-27 LAB — CBC WITH DIFFERENTIAL/PLATELET
Abs Granulocyte: 4.3 10*3/uL (ref 1.5–6.5)
BASOS ABS: 0 10*3/uL (ref 0.0–0.1)
Basophils Relative: 0 %
EOS PCT: 1 %
Eosinophils Absolute: 0.1 10*3/uL (ref 0.0–0.5)
HCT: 37.2 % (ref 34.8–46.6)
Hemoglobin: 12.3 g/dL (ref 11.6–15.9)
LYMPHS PCT: 32 %
Lymphs Abs: 2.2 10*3/uL (ref 0.9–3.3)
MCH: 30.4 pg (ref 25.1–34.0)
MCHC: 33.1 g/dL (ref 31.5–36.0)
MCV: 92.1 fL (ref 79.5–101.0)
Monocytes Absolute: 0.4 10*3/uL (ref 0.1–0.9)
Monocytes Relative: 5 %
NEUTROS ABS: 4.3 10*3/uL (ref 1.5–6.5)
NEUTROS PCT: 62 %
PLATELETS: 177 10*3/uL (ref 145–400)
RBC: 4.04 MIL/uL (ref 3.70–5.45)
RDW: 13.9 % (ref 11.2–16.1)
WBC: 7.1 10*3/uL (ref 3.9–10.3)

## 2017-07-27 MED ORDER — ANASTROZOLE 1 MG PO TABS: 1.0000 mg | ORAL_TABLET | Freq: Every day | ORAL | 4 refills | Status: DC

## 2017-07-27 NOTE — Telephone Encounter (Signed)
Patient declined AVS and calendar of upcoming March appointments. Patient scheduled per 1/7 los.

## 2017-07-30 DIAGNOSIS — Z170 Estrogen receptor positive status [ER+]: Secondary | ICD-10-CM | POA: Diagnosis not present

## 2017-07-30 DIAGNOSIS — C50411 Malignant neoplasm of upper-outer quadrant of right female breast: Secondary | ICD-10-CM | POA: Diagnosis not present

## 2017-07-31 ENCOUNTER — Telehealth: Payer: Self-pay | Admitting: *Deleted

## 2017-07-31 NOTE — Telephone Encounter (Signed)
This RN attempted to return call to pt per her VM stating she needs advice per " I think I accidentally took 2 of the anastrozole this morning "  Obtained identified VM per return call - message left informing pt no issues per above but she should hold her dose for tomorrow and resume 1 tablet a day on Sunday.

## 2017-09-04 DIAGNOSIS — Z170 Estrogen receptor positive status [ER+]: Secondary | ICD-10-CM | POA: Diagnosis not present

## 2017-09-04 DIAGNOSIS — C50011 Malignant neoplasm of nipple and areola, right female breast: Secondary | ICD-10-CM | POA: Diagnosis not present

## 2017-09-04 DIAGNOSIS — R5383 Other fatigue: Secondary | ICD-10-CM | POA: Diagnosis not present

## 2017-09-04 DIAGNOSIS — Z9011 Acquired absence of right breast and nipple: Secondary | ICD-10-CM | POA: Diagnosis not present

## 2017-09-04 DIAGNOSIS — Z79811 Long term (current) use of aromatase inhibitors: Secondary | ICD-10-CM | POA: Diagnosis not present

## 2017-09-04 DIAGNOSIS — C50911 Malignant neoplasm of unspecified site of right female breast: Secondary | ICD-10-CM | POA: Diagnosis not present

## 2017-09-04 DIAGNOSIS — C50411 Malignant neoplasm of upper-outer quadrant of right female breast: Secondary | ICD-10-CM | POA: Diagnosis not present

## 2017-09-04 DIAGNOSIS — R928 Other abnormal and inconclusive findings on diagnostic imaging of breast: Secondary | ICD-10-CM | POA: Diagnosis not present

## 2017-09-04 DIAGNOSIS — Z923 Personal history of irradiation: Secondary | ICD-10-CM | POA: Diagnosis not present

## 2017-09-27 NOTE — Progress Notes (Signed)
[name]  Telephone:([phone]) [phone] Fax:(336) [phone]     ID: [name] DOB: 26-Oct-1945  MR#: [id number]  OMB#:[id number]  Patient Care Team: Drake Leach, MD as PCP - General (Internal Medicine) Angelina Ok, MD as Referring Physician (Surgery) Lorenza Winkleman, Virgie Dad, MD as Consulting Physician (Oncology) Thea Silversmith, MD (Inactive) as Referring Physician (Radiation Oncology) Mauri Pole, MD as Consulting Physician (Gastroenterology) OTHER MD:  CHIEF COMPLAINT: Estrogen receptor positive lobular breast cancer   CURRENT TREATMENT: to start exemestane   HISTORY OF CURRENT ILLNESS: From the original intake note:  Diane Macias had bilateral screening mammography at [facility] 11/30/2014 showing the breast density to be category B. There was a possible mass in the right breast. On 12/10/2016 she underwent right diagnostic mammography with tomography and right breast ultrasonography. This confirmed a 0.4 cm spiculated mass in the periareolar area of the right breast. Ultrasound showed a small irregular hypoechoic mass at the 9:00 position 1 cm from the nipple, measuring 0.5 cm. Ultrasound of the right axilla was benign.  Biopsy of the right breast area in question 12/25/2016 showed an invasive lobular carcinoma, grade 2, estrogen receptor positive at 85%, progesterone receptor positive at 65%, both with strong staining intensity, and HER-2 not amplified by immunohistochemistry (1+).   On 01/19/2017 she underwent bilateral breast MRIs which showed in addition to the known right breast mass an additional area of concern in the central right breast. This measured 0.6 cm. MRI guided biopsy 02/06/2017 confirmed that this second area also was invasive lobular carcinoma, grade 2. It was located (by MRI) 1.5 cm inferior medial to the previously biopsied mass. Also there was a 0.6 mm sternal lesion in the inferior aspect of the sternum.  Because of the sternal lesion  on the patient underwent PET scanning 02/12/2017. This was entirely negative and the sternal lesion is felt to be most consistent with a bone island  On 02/20/2017 the patient underwent right lumpectomy. This showed ([id number]) invasive lobular carcinoma measuring 0.5 cm, grade 2, with evidence of lobular carcinoma in situ. Margins were clear. Both sentinel lymph nodes were clear. [NB: the second biopsied lesion was present in the surgical sample but not addressed in the report; I have discussed this with pathology and Diane Macias has kindly agreed to review the case, measure this lesion and margins, and dictate an addendum]  The patient's subsequent history is as detailed below.  INTERVAL HISTORY: Diane Macias returns today for follow up and treatment of her estrogen receptor positive lobular breast cancer. She continues on anastrozole, with good tolerance overall. She denies hot flashes and notes that she already had a prior hx of vaginal dryness. On anastrozole, she reports fatigue, consistent increased blood sugar levels, and BLE cramping. She notes that her BLE cramping is worsened when standing from a prolonged seated position. She notes that she has BLE cramping at night that wakes her from her sleep.   She also has had more diarrhea, which she feels may be due to anastrozole.  She also cannot understand why she is not losing weight since she is now exercising twice a week  Since her last visit here she had right diagnostic mammography and tomography as well as right breast ultrasonography at [facility], on 09/04/2017, showing the breast density to be category B, with no evidence of malignancy.  REVIEW OF SYSTEMS: Diane Macias reports that for exercise, she has joined a cancer fit program in [facility] and she is currently exercising twice a week for  the past 6 weeks, which she thoroughly enjoys. She is excited that she will be remodeling her home and it will be a major renovation. She denies unusual  headaches, visual changes, nausea, vomiting, or dizziness. There has been no unusual cough, phlegm production, or pleurisy. This been no change in bowel or bladder habits. She denies unexplained fatigue or unexplained weight loss, bleeding, rash, or fever. A detailed review of systems was otherwise stable.    PAST MEDICAL HISTORY: Past Medical History:  Diagnosis Date  . Adenomatous colon polyp 04/18/2011  . Allergy   . Arthritis   . Cancer ([place]) 1997   squamous and basal cell  . Diabetes mellitus    type II  . Hyperlipidemia   . Hypertension   . Osteopenia   . Sleep apnea    CPAP    PAST SURGICAL HISTORY: Past Surgical History:  Procedure Laterality Date  . arthroscopic knee  01/2008   right knee  . CATARACT EXTRACTION Left 2016  . CHOLECYSTECTOMY  1981  . COLONOSCOPY    . FOOT SURGERY  07/2007   right foot (BUNION)  . KNEE ARTHROSCOPY     left  . TONSILLECTOMY  1953  . torn maniscus  11/2010   left knee  . VAGINAL HYSTERECTOMY  1994    FAMILY HISTORY Family History  Problem Relation Age of Onset  . Lung cancer Father 36  . Colon cancer Neg Hx   The patient's father died from lung cancer in the setting of tobacco abuse. He was 72 years old. The patient's mother died at 5 with Alzheimer's disease. The patient had no brothers, 2 sisters. One sister was diagnosed with breast cancer at age 7. There is also a maternal cousin with breast cancer. There is no history of ovarian or prostate cancer in the family. The patient was genetically tested at [facility] and reportedly carries a heterozygous MutYH mutation. I do have requested that report  GYNECOLOGIC HISTORY:  No LMP recorded. Patient has had a hysterectomy. Menarche age 68, first live birth age 48, the patient is Diane Macias P2. She underwent total abdominal hysterectomy with bilateral salpingo-oophorectomy approximately age 24. She took hormone replacement for approximately 17 years.  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 Daughter Diane Macias is a Risk manager in [place]. Daughter Diane Macias is Web designer at the country day school locally. The patient has 3 grandchildren. She is a Tourist information centre manager.    ADVANCED DIRECTIVES:    HEALTH MAINTENANCE: Social History   Tobacco Use  . Smoking status: Former Smoker    Last attempt to quit: 07/21/1968    Years since quitting: 49.2  . Smokeless tobacco: Never Used  Substance Use Topics  . Alcohol use: Yes    Comment: rarely wine  . Drug use: No     Colonoscopy: May 2018; Nandigam  PAP:  Bone density:   Allergies  Allergen Reactions  . Ciprofloxacin Swelling and Rash    other  . Flagyl [Metronidazole] Swelling  . Celecoxib Swelling  . Diazepam Other (See Comments)    dpression  . Hydrocodone Other (See Comments)    Could not sleep  . Oxycodone Other (See Comments)    Restless  . Sulfa Antibiotics Rash  . Sulfonamide Derivatives Rash    Current Outpatient Medications  Medication Sig Dispense Refill  . cholecalciferol (VITAMIN D) 1000 UNITS tablet Take 1,000 Units by mouth daily.      Marland Kitchen  glucose blood test strip 1 each by Other route as needed for other. Use as instructed    . Ibuprofen (ADVIL) 200 MG CAPS Take 2 capsules by mouth as needed.    Marland Kitchen lisinopril (PRINIVIL,ZESTRIL) 10 MG tablet Take 10 mg by mouth daily.      . metFORMIN (GLUCOPHAGE) 500 MG tablet Take 500 mg by mouth daily after supper.      . ONE TOUCH LANCETS MISC by Does not apply route.    . polyethylene glycol powder (GLYCOLAX/MIRALAX) powder Take 1 dose in 8oz of fluid x 7 (Patient taking differently: Take 1 dose in 8oz of fluid x 7) 255 g 0  . pravastatin (PRAVACHOL) 10 MG tablet Take 10 mg by mouth daily.      . Probiotic Product (ALIGN PO) Take 1 capsule by mouth daily.    . vitamin B-12 (CYANOCOBALAMIN) 1000 MCG tablet Take 1,000 mcg by mouth daily.    Marland Kitchen aspirin 81 MG tablet Take 81 mg by mouth 2 (two) times  a week.       Current Facility-Administered Medications  Medication Dose Route Frequency Provider Last Rate Last Dose  . 0.9 %  sodium chloride infusion  500 mL Intravenous Continuous Nandigam, Venia Minks, MD        OBJECTIVE: Middle-aged white woman in no acute distress  Vitals:   09/28/17 1447  BP: (!) 146/70  Pulse: 69  Resp: 18  Temp: 97.7 F (36.5 C)  SpO2: 98%     Body mass index is 37.86 kg/m.   Wt Readings from Last 3 Encounters:  09/28/17 207 lb (93.9 kg)  07/27/17 207 lb (93.9 kg)  03/17/17 205 lb 4.8 oz (93.1 kg)      ECOG FS:1 - Symptomatic but completely ambulatory  Sclerae unicteric, pupils round and equal Oropharynx clear and moist No cervical or supraclavicular adenopathy Lungs no rales or rhonchi Heart regular rate and rhythm Abd soft, nontender, positive bowel sounds MSK no focal spinal tenderness, no upper extremity lymphedema Neuro: nonfocal, well oriented, appropriate affect Breasts: The right breast is status post lumpectomy followed by radiation.  There is the expected firmness in the breast but no evidence of local recurrence.  The left breast is benign.  Both axillae are benign.  LAB RESULTS:  CMP     Component Value Date/Time   NA 138 09/28/2017 1423   NA 139 03/17/2017 1524   K 4.2 09/28/2017 1423   K 4.2 03/17/2017 1524   CL 106 09/28/2017 1423   CO2 24 09/28/2017 1423   CO2 23 03/17/2017 1524   GLUCOSE 101 09/28/2017 1423   GLUCOSE 102 03/17/2017 1524   BUN 28 (H) 09/28/2017 1423   BUN 28.6 (H) 03/17/2017 1524   CREATININE 0.93 09/28/2017 1423   CREATININE 0.9 03/17/2017 1524   CALCIUM 9.8 09/28/2017 1423   CALCIUM 9.8 03/17/2017 1524   PROT 6.9 09/28/2017 1423   PROT 6.8 03/17/2017 1524   ALBUMIN 3.9 09/28/2017 1423   ALBUMIN 3.8 03/17/2017 1524   AST 17 09/28/2017 1423   AST 15 03/17/2017 1524   ALT 20 09/28/2017 1423   ALT 16 03/17/2017 1524   ALKPHOS 91 09/28/2017 1423   ALKPHOS 88 03/17/2017 1524   BILITOT 0.4  09/28/2017 1423   BILITOT 0.31 03/17/2017 1524   GFRNONAA >60 09/28/2017 1423   GFRAA >60 09/28/2017 1423    No results found for: TOTALPROTELP, ALBUMINELP, A1GS, A2GS, BETS, BETA2SER, GAMS, MSPIKE, SPEI  No results found for: KPAFRELGTCHN, LAMBDASER,  [facility]  Lab Results  Component Value Date   WBC 8.1 09/28/2017   NEUTROABS 4.7 09/28/2017   HGB 12.0 09/28/2017   HCT 36.4 09/28/2017   MCV 90.8 09/28/2017   PLT 164 09/28/2017      Chemistry      Component Value Date/Time   NA 138 09/28/2017 1423   NA 139 03/17/2017 1524   K 4.2 09/28/2017 1423   K 4.2 03/17/2017 1524   CL 106 09/28/2017 1423   CO2 24 09/28/2017 1423   CO2 23 03/17/2017 1524   BUN 28 (H) 09/28/2017 1423   BUN 28.6 (H) 03/17/2017 1524   CREATININE 0.93 09/28/2017 1423   CREATININE 0.9 03/17/2017 1524      Component Value Date/Time   CALCIUM 9.8 09/28/2017 1423   CALCIUM 9.8 03/17/2017 1524   ALKPHOS 91 09/28/2017 1423   ALKPHOS 88 03/17/2017 1524   AST 17 09/28/2017 1423   AST 15 03/17/2017 1524   ALT 20 09/28/2017 1423   ALT 16 03/17/2017 1524   BILITOT 0.4 09/28/2017 1423   BILITOT 0.31 03/17/2017 1524       No results found for: LABCA2  No components found for: [id number]  No results for input(s): INR in the last 168 hours.  No results found for: LABCA2  No results found for: NTI144  No results found for: RXV400  No results found for: QQP619  No results found for: CA2729  No components found for: HGQUANT  No results found for: CEA1 / No results found for: CEA1   No results found for: AFPTUMOR  No results found for: CHROMOGRNA  No results found for: PSA1  Appointment on 09/28/2017  Component Date Value Ref Range Status  . Sodium 09/28/2017 138  136 - 145 mmol/L Final  . Potassium 09/28/2017 4.2  3.5 - 5.1 mmol/L Final  . Chloride 09/28/2017 106  98 - 109 mmol/L Final  . CO2 09/28/2017 24  22 - 29 mmol/L Final  . Glucose, Bld 09/28/2017 101  70 - 140 mg/dL Final  .  BUN 09/28/2017 28* 7 - 26 mg/dL Final  . Creatinine, Ser 09/28/2017 0.93  0.60 - 1.10 mg/dL Final  . Calcium 09/28/2017 9.8  8.4 - 10.4 mg/dL Final  . Total Protein 09/28/2017 6.9  6.4 - 8.3 g/dL Final  . Albumin 09/28/2017 3.9  3.5 - 5.0 g/dL Final  . AST 09/28/2017 17  5 - 34 U/L Final  . ALT 09/28/2017 20  0 - 55 U/L Final  . Alkaline Phosphatase 09/28/2017 91  40 - 150 U/L Final  . Total Bilirubin 09/28/2017 0.4  0.2 - 1.2 mg/dL Final  . GFR calc non Af Amer 09/28/2017 >60  >60 mL/min Final  . GFR calc Af Amer 09/28/2017 >60  >60 mL/min Final   Comment: (NOTE) The eGFR has been calculated using the CKD EPI equation. This calculation has not been validated in all clinical situations. eGFR's persistently <60 mL/min signify possible Chronic Kidney Disease.   Georgiann Hahn gap 09/28/2017 8  3 - 11 Final   Performed at [facility], [place] [address], [place], [place] [address]  . WBC 09/28/2017 8.1  3.9 - 10.3 K/uL Final  . RBC 09/28/2017 4.01  3.70 - 5.45 MIL/uL Final  . Hemoglobin 09/28/2017 12.0  11.6 - 15.9 g/dL Final  . HCT 09/28/2017 36.4  34.8 - 46.6 % Final  . MCV 09/28/2017 90.8  79.5 - 101.0 fL Final  . MCH 09/28/2017 30.0  25.1 - 34.0 pg Final  . MCHC 09/28/2017 33.0  31.5 - 36.0 g/dL Final  . RDW 09/28/2017 13.8  11.2 - 14.5 % Final  . Platelets 09/28/2017 164  145 - 400 K/uL Final  . Neutrophils Relative % 09/28/2017 59  % Final  . Neutro Abs 09/28/2017 4.7  1.5 - 6.5 K/uL Final  . Lymphocytes Relative 09/28/2017 32  % Final  . Lymphs Abs 09/28/2017 2.6  0.9 - 3.3 K/uL Final  . Monocytes Relative 09/28/2017 7  % Final  . Monocytes Absolute 09/28/2017 0.6  0.1 - 0.9 K/uL Final  . Eosinophils Relative 09/28/2017 1  % Final  . Eosinophils Absolute 09/28/2017 0.1  0.0 - 0.5 K/uL Final  . Basophils Relative 09/28/2017 1  % Final  . Basophils Absolute 09/28/2017 0.0  0.0 - 0.1 K/uL Final   Performed at [facility], [place] [address] [place], [place] [address]    (this displays the last labs from the last 3 days)  No results found for: TOTALPROTELP, ALBUMINELP, A1GS, A2GS, BETS, BETA2SER, GAMS, MSPIKE, SPEI (this displays SPEP labs)  No results found for: KPAFRELGTCHN, LAMBDASER, KAPLAMBRATIO (kappa/lambda light chains)  No results found for: HGBA, HGBA2QUANT, HGBFQUANT, HGBSQUAN (Hemoglobinopathy evaluation)   No results found for: LDH  No results found for: IRON, TIBC, IRONPCTSAT (Iron and TIBC)  No results found for: FERRITIN  Urinalysis No results found for: COLORURINE, APPEARANCEUR, LABSPEC, PHURINE, GLUCOSEU, HGBUR, BILIRUBINUR, KETONESUR, PROTEINUR, UROBILINOGEN, NITRITE, LEUKOCYTESUR   STUDIES:   ELIGIBLE FOR AVAILABLE RESEARCH PROTOCOL: no  ASSESSMENT: 72 y.o. [place], [place] woman status post right lumpectomy 02/20/2017 for 2 separate areas of invasive lobular carcinoma, grade 2, pT1a pN0, stage IA, estrogen and progesterone receptor strongly positive, HER-2 not amplified  (a) the second area of known invasive lobular carcinoma was also included in the surgical sample; its dimensions are presumably smaller than or equal to 5 mm and did not involve margins  (1) Oncotype not needed and no chemotherapy is warranted  (2) adjuvant radiation: 4256 cGy to the right breast completed 05/25/17 (see Diane Unknown Jim 06/22/2017 note)  (3) anastrozole started 07/27/2017--discontinued 10/07/2017 with poor tolerance  (a) bone density at [facility] shows a T score of +0.4  (4) per patient, she carries a heterozygous MutYH mutation; genetics report has been requested  (5) exemestane to start 11/18/2017  PLAN: Media is having some symptoms that I think may well be related to her anastrozole, including fatigue and cramps.  Of course fatigue could be due to radiation as well.  The cramps could be due to dehydration or a number of other causes but in general she has been feeling worse on the anastrozole and I think  we are going to stop that medication as a result  We then discussed her options which are going on to tamoxifen or considering another aromatase inhibitor.  My suggestion was that she try exemestane.  We discussed the possible toxicity side effects and complications of that agent as well as concerns regarding possible cost.  I would like her to be off anastrozole at least several weeks so she is "back to baseline) before starting anastrozole.  The tentative starting date will be Nov 18, 2017.  She will return to see me in August after she has been on that medication about 3 months.  If she tolerates it well the plan will be to continue that for 5 years  If not then she will have the choice between tamoxifen  and fulvestrant  In terms of weight loss we discussed the weight loss equation which is fairly brittle namely calories and minus calories out.  I suggested she absolutely eliminate carbs.  She will consider that  She knows to call for any other issues that may develop before her next    Christionna Poland, Virgie Dad, MD  09/28/17 3:03 PM Medical Oncology and Hematology [facility] [place], [place] [address] Tel. [phone]    Fax. [phone]  This document serves as a record of services personally performed by Lurline Del, MD. It was created on his behalf by Steva Colder, a trained medical scribe. The creation of this record is based on the scribe's personal observations and the provider's statements to them.   I have reviewed the above documentation for accuracy and completeness, and I agree with the above.

## 2017-09-28 ENCOUNTER — Inpatient Hospital Stay (HOSPITAL_BASED_OUTPATIENT_CLINIC_OR_DEPARTMENT_OTHER): Payer: PPO | Admitting: Oncology

## 2017-09-28 ENCOUNTER — Inpatient Hospital Stay: Payer: PPO | Attending: Oncology

## 2017-09-28 ENCOUNTER — Telehealth: Payer: Self-pay | Admitting: Oncology

## 2017-09-28 VITALS — BP 146/70 | HR 69 | Temp 97.7°F | Resp 18 | Ht 62.0 in | Wt 207.0 lb

## 2017-09-28 DIAGNOSIS — Z170 Estrogen receptor positive status [ER+]: Secondary | ICD-10-CM | POA: Insufficient documentation

## 2017-09-28 DIAGNOSIS — C50811 Malignant neoplasm of overlapping sites of right female breast: Secondary | ICD-10-CM

## 2017-09-28 LAB — CBC WITH DIFFERENTIAL/PLATELET
BASOS ABS: 0 10*3/uL (ref 0.0–0.1)
Basophils Relative: 1 %
EOS ABS: 0.1 10*3/uL (ref 0.0–0.5)
EOS PCT: 1 %
HCT: 36.4 % (ref 34.8–46.6)
Hemoglobin: 12 g/dL (ref 11.6–15.9)
Lymphocytes Relative: 32 %
Lymphs Abs: 2.6 10*3/uL (ref 0.9–3.3)
MCH: 30 pg (ref 25.1–34.0)
MCHC: 33 g/dL (ref 31.5–36.0)
MCV: 90.8 fL (ref 79.5–101.0)
MONO ABS: 0.6 10*3/uL (ref 0.1–0.9)
Monocytes Relative: 7 %
Neutro Abs: 4.7 10*3/uL (ref 1.5–6.5)
Neutrophils Relative %: 59 %
PLATELETS: 164 10*3/uL (ref 145–400)
RBC: 4.01 MIL/uL (ref 3.70–5.45)
RDW: 13.8 % (ref 11.2–14.5)
WBC: 8.1 10*3/uL (ref 3.9–10.3)

## 2017-09-28 LAB — COMPREHENSIVE METABOLIC PANEL
ALK PHOS: 91 U/L (ref 40–150)
ALT: 20 U/L (ref 0–55)
AST: 17 U/L (ref 5–34)
Albumin: 3.9 g/dL (ref 3.5–5.0)
Anion gap: 8 (ref 3–11)
BILIRUBIN TOTAL: 0.4 mg/dL (ref 0.2–1.2)
BUN: 28 mg/dL — AB (ref 7–26)
CALCIUM: 9.8 mg/dL (ref 8.4–10.4)
CO2: 24 mmol/L (ref 22–29)
CREATININE: 0.93 mg/dL (ref 0.60–1.10)
Chloride: 106 mmol/L (ref 98–109)
GFR calc Af Amer: >60 mL/min (ref >60)
GFR calc non Af Amer: >60 mL/min (ref >60)
GLUCOSE: 101 mg/dL (ref 70–140)
Potassium: 4.2 mmol/L (ref 3.5–5.1)
Sodium: 138 mmol/L (ref 136–145)
TOTAL PROTEIN: 6.9 g/dL (ref 6.4–8.3)

## 2017-09-28 NOTE — Telephone Encounter (Signed)
Gave avs and calendar for august  °

## 2017-09-30 DIAGNOSIS — H2511 Age-related nuclear cataract, right eye: Secondary | ICD-10-CM | POA: Diagnosis not present

## 2017-09-30 DIAGNOSIS — Z83511 Family history of glaucoma: Secondary | ICD-10-CM | POA: Diagnosis not present

## 2017-09-30 DIAGNOSIS — H02831 Dermatochalasis of right upper eyelid: Secondary | ICD-10-CM | POA: Diagnosis not present

## 2017-09-30 DIAGNOSIS — H02834 Dermatochalasis of left upper eyelid: Secondary | ICD-10-CM | POA: Diagnosis not present

## 2017-11-03 DIAGNOSIS — Z9989 Dependence on other enabling machines and devices: Secondary | ICD-10-CM | POA: Diagnosis not present

## 2017-11-03 DIAGNOSIS — G4733 Obstructive sleep apnea (adult) (pediatric): Secondary | ICD-10-CM | POA: Diagnosis not present

## 2017-11-03 DIAGNOSIS — M170 Bilateral primary osteoarthritis of knee: Secondary | ICD-10-CM | POA: Diagnosis not present

## 2017-11-03 DIAGNOSIS — I10 Essential (primary) hypertension: Secondary | ICD-10-CM | POA: Diagnosis not present

## 2017-11-05 ENCOUNTER — Telehealth: Payer: Self-pay | Admitting: Medical Oncology

## 2017-11-05 DIAGNOSIS — F5104 Psychophysiologic insomnia: Secondary | ICD-10-CM | POA: Diagnosis not present

## 2017-11-05 DIAGNOSIS — G4733 Obstructive sleep apnea (adult) (pediatric): Secondary | ICD-10-CM | POA: Diagnosis not present

## 2017-11-05 DIAGNOSIS — Z9989 Dependence on other enabling machines and devices: Secondary | ICD-10-CM | POA: Diagnosis not present

## 2017-11-05 NOTE — Telephone Encounter (Signed)
Returned call mbut unable to reach pt , phoned just rang, no VM.

## 2017-11-05 NOTE — Telephone Encounter (Signed)
She wants to get started on examestane on May 1st. Please send RX to either pharmacy on her chart.

## 2017-11-09 ENCOUNTER — Other Ambulatory Visit: Payer: Self-pay | Admitting: Oncology

## 2017-11-09 MED ORDER — EXEMESTANE 25 MG PO TABS: 25.0000 mg | ORAL_TABLET | Freq: Every day | ORAL | 4 refills | Status: DC

## 2017-11-11 DIAGNOSIS — J3089 Other allergic rhinitis: Secondary | ICD-10-CM | POA: Diagnosis not present

## 2017-11-11 DIAGNOSIS — R05 Cough: Secondary | ICD-10-CM | POA: Diagnosis not present

## 2017-11-11 DIAGNOSIS — R0982 Postnasal drip: Secondary | ICD-10-CM | POA: Diagnosis not present

## 2017-11-24 DIAGNOSIS — G4733 Obstructive sleep apnea (adult) (pediatric): Secondary | ICD-10-CM | POA: Diagnosis not present

## 2017-12-29 DIAGNOSIS — R05 Cough: Secondary | ICD-10-CM | POA: Diagnosis not present

## 2017-12-29 DIAGNOSIS — J069 Acute upper respiratory infection, unspecified: Secondary | ICD-10-CM | POA: Diagnosis not present

## 2017-12-29 DIAGNOSIS — J3089 Other allergic rhinitis: Secondary | ICD-10-CM | POA: Diagnosis not present

## 2018-03-01 ENCOUNTER — Inpatient Hospital Stay: Payer: PPO | Attending: Oncology

## 2018-03-01 DIAGNOSIS — Z79811 Long term (current) use of aromatase inhibitors: Secondary | ICD-10-CM | POA: Diagnosis not present

## 2018-03-01 DIAGNOSIS — Z800 Family history of malignant neoplasm of digestive organs: Secondary | ICD-10-CM | POA: Insufficient documentation

## 2018-03-01 DIAGNOSIS — Z170 Estrogen receptor positive status [ER+]: Secondary | ICD-10-CM | POA: Insufficient documentation

## 2018-03-01 DIAGNOSIS — Z923 Personal history of irradiation: Secondary | ICD-10-CM | POA: Insufficient documentation

## 2018-03-01 DIAGNOSIS — Z7982 Long term (current) use of aspirin: Secondary | ICD-10-CM | POA: Diagnosis not present

## 2018-03-01 DIAGNOSIS — Z79899 Other long term (current) drug therapy: Secondary | ICD-10-CM | POA: Diagnosis not present

## 2018-03-01 DIAGNOSIS — M8580 Other specified disorders of bone density and structure, unspecified site: Secondary | ICD-10-CM | POA: Diagnosis not present

## 2018-03-01 DIAGNOSIS — Z7984 Long term (current) use of oral hypoglycemic drugs: Secondary | ICD-10-CM | POA: Insufficient documentation

## 2018-03-01 DIAGNOSIS — N951 Menopausal and female climacteric states: Secondary | ICD-10-CM | POA: Diagnosis not present

## 2018-03-01 DIAGNOSIS — Z87891 Personal history of nicotine dependence: Secondary | ICD-10-CM | POA: Insufficient documentation

## 2018-03-01 DIAGNOSIS — R635 Abnormal weight gain: Secondary | ICD-10-CM | POA: Diagnosis not present

## 2018-03-01 DIAGNOSIS — E119 Type 2 diabetes mellitus without complications: Secondary | ICD-10-CM | POA: Insufficient documentation

## 2018-03-01 DIAGNOSIS — Z801 Family history of malignant neoplasm of trachea, bronchus and lung: Secondary | ICD-10-CM | POA: Diagnosis not present

## 2018-03-01 DIAGNOSIS — C50811 Malignant neoplasm of overlapping sites of right female breast: Secondary | ICD-10-CM | POA: Insufficient documentation

## 2018-03-01 LAB — CBC WITH DIFFERENTIAL/PLATELET
BASOS ABS: 0 10*3/uL (ref 0.0–0.1)
BASOS PCT: 0 %
EOS ABS: 0.2 10*3/uL (ref 0.0–0.5)
EOS PCT: 2 %
HEMATOCRIT: 37.8 % (ref 34.8–46.6)
Hemoglobin: 12.2 g/dL (ref 11.6–15.9)
Lymphocytes Relative: 33 %
Lymphs Abs: 2.6 10*3/uL (ref 0.9–3.3)
MCH: 30.6 pg (ref 25.1–34.0)
MCHC: 32.3 g/dL (ref 31.5–36.0)
MCV: 94.7 fL (ref 79.5–101.0)
MONO ABS: 0.6 10*3/uL (ref 0.1–0.9)
MONOS PCT: 8 %
NEUTROS ABS: 4.5 10*3/uL (ref 1.5–6.5)
Neutrophils Relative %: 57 %
PLATELETS: 176 10*3/uL (ref 145–400)
RBC: 3.99 MIL/uL (ref 3.70–5.45)
RDW: 14.5 % (ref 11.2–14.5)
WBC: 7.9 10*3/uL (ref 3.9–10.3)

## 2018-03-01 LAB — COMPREHENSIVE METABOLIC PANEL
ALK PHOS: 90 U/L (ref 38–126)
ALT: 17 U/L (ref 0–44)
ANION GAP: 9 (ref 5–15)
AST: 16 U/L (ref 15–41)
Albumin: 3.6 g/dL (ref 3.5–5.0)
BILIRUBIN TOTAL: 0.4 mg/dL (ref 0.3–1.2)
BUN: 27 mg/dL — AB (ref 8–23)
CALCIUM: 9.7 mg/dL (ref 8.9–10.3)
CO2: 23 mmol/L (ref 22–32)
CREATININE: 1.01 mg/dL — AB (ref 0.44–1.00)
Chloride: 108 mmol/L (ref 98–111)
GFR calc Af Amer: >60 mL/min (ref >60)
GFR calc non Af Amer: 54 mL/min — ABNORMAL LOW (ref >60)
Glucose, Bld: 101 mg/dL — ABNORMAL HIGH (ref 70–99)
Potassium: 4.8 mmol/L (ref 3.5–5.1)
Sodium: 140 mmol/L (ref 135–145)
TOTAL PROTEIN: 7 g/dL (ref 6.5–8.1)

## 2018-03-02 LAB — VITAMIN D 25 HYDROXY (VIT D DEFICIENCY, FRACTURES): Vit D, 25-Hydroxy: 34.1 ng/mL (ref 30.0–100.0)

## 2018-03-02 NOTE — Progress Notes (Signed)
Diane Macias  Telephone:([phone] Fax:(336) [phone]     ID: ADAMARIZ GILLOTT DOB: Nov 22, 1945  MR#: [id number]  JYN#:[id number]  Patient Care Team: Drake Leach, MD as PCP - General (Internal Medicine) Angelina Ok, MD as Referring Physician (Surgery) Magrinat, Virgie Dad, MD as Consulting Physician (Oncology) Thea Silversmith, MD as Referring Physician (Radiation Oncology) Mauri Pole, MD as Consulting Physician (Gastroenterology) OTHER MD:  CHIEF COMPLAINT: Estrogen receptor positive lobular breast cancer   CURRENT TREATMENT: Exemestane   HISTORY OF CURRENT ILLNESS: From the original intake note:  Diane Macias had bilateral screening mammography at [facility] 11/30/2014 showing the breast density to be category B. There was a possible mass in the right breast. On 12/10/2016 she underwent right diagnostic mammography with tomography and right breast ultrasonography. This confirmed a 0.4 cm spiculated mass in the periareolar area of the right breast. Ultrasound showed a small irregular hypoechoic mass at the 9:00 position 1 cm from the nipple, measuring 0.5 cm. Ultrasound of the right axilla was benign.  Biopsy of the right breast area in question 12/25/2016 showed an invasive lobular carcinoma, grade 2, estrogen receptor positive at 85%, progesterone receptor positive at 65%, both with strong staining intensity, and HER-2 not amplified by immunohistochemistry (1+).   On 01/19/2017 she underwent bilateral breast MRIs which showed in addition to the known right breast mass an additional area of concern in the central right breast. This measured 0.6 cm. MRI guided biopsy 02/06/2017 confirmed that this second area also was invasive lobular carcinoma, grade 2. It was located (by MRI) 1.5 cm inferior medial to the previously biopsied mass. Also there was a 0.6 mm sternal lesion in the inferior aspect of the sternum.  Because of the sternal lesion on the patient  underwent PET scanning 02/12/2017. This was entirely negative and the sternal lesion is felt to be most consistent with a bone island  On 02/20/2017 the patient underwent right lumpectomy. This showed ([id number]) invasive lobular carcinoma measuring 0.5 cm, grade 2, with evidence of lobular carcinoma in situ. Margins were clear. Both sentinel lymph nodes were clear. [NB: the second biopsied lesion was present in the surgical sample but not addressed in the report; I have discussed this with pathology and Dr Catha Brow has kindly agreed to review the case, measure this lesion and margins, and dictate an addendum]  The patient's subsequent history is as detailed below.  INTERVAL HISTORY: Diane Macias returns today for follow up and treatment of her estrogen receptor positive lobular breast cancer. She continues on exemestane, with fair tolerance. She has intense hot flashes which are worse when she is outside in hot weather. She pays $30 for a 3 month supply.   She had bilateral mammography and right ultrasonography on 09/04/2017 at [facility] which showed breast density category B. There was an oval mass in the right breast subareolar. This was benign. A 67-monthfollow up was recommended.   She is scheduled for a 628-monthepeat mammogram on 03/05/2018.  She had her last bone density in May 2017   REVIEW OF SYSTEMS: [place] that she started diflusinal, for arthritis, She feels itching in her hands, ankles, and feet. She also has itching and burning in her scalp, that comes and goes. She denies unusual headaches, visual changes, nausea, vomiting, or dizziness. There has been no unusual cough, phlegm production, or pleurisy. There has been no change in bowel or bladder habits. She denies unexplained fatigue or unexplained weight loss, bleeding, rash, or fever. A detailed  review of systems was otherwise stable.    PAST MEDICAL HISTORY: Past Medical History:  Diagnosis Date  . Adenomatous colon polyp  04/18/2011  . Allergy   . Arthritis   . Cancer ([place]) 1997   squamous and basal cell  . Diabetes mellitus    type II  . Hyperlipidemia   . Hypertension   . Osteopenia   . Sleep apnea    CPAP    PAST SURGICAL HISTORY: Past Surgical History:  Procedure Laterality Date  . arthroscopic knee  01/2008   right knee  . CATARACT EXTRACTION Left 2016  . CHOLECYSTECTOMY  1981  . COLONOSCOPY    . FOOT SURGERY  07/2007   right foot (BUNION)  . KNEE ARTHROSCOPY     left  . TONSILLECTOMY  1953  . torn maniscus  11/2010   left knee  . VAGINAL HYSTERECTOMY  1994    FAMILY HISTORY Family History  Problem Relation Age of Onset  . Lung cancer Father 44  . Colon cancer Neg Hx   The patient's father died from lung cancer in the setting of tobacco abuse. He was 72 years old. The patient's mother died at 30 with Alzheimer's disease. The patient had no brothers, 2 sisters. One sister was diagnosed with breast cancer at age 70. There is also a maternal cousin with breast cancer. There is no history of ovarian or prostate cancer in the family. The patient was genetically tested at [facility] and reportedly carries a heterozygous MutYH mutation. I do have requested that report  GYNECOLOGIC HISTORY:  No LMP recorded. Patient has had a hysterectomy. Menarche age 51, first live birth age 53, the patient is Carmine P2. She underwent total abdominal hysterectomy with bilateral salpingo-oophorectomy approximately age 26. She took hormone replacement for approximately 17 years.  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 Daughter Diane Macias is a Risk manager in [place]. Daughter Diane Macias is Web designer at the country day [employer] locally. The patient has 3 grandchildren. She is a Tourist information centre manager.    ADVANCED DIRECTIVES:    HEALTH MAINTENANCE: Social History   Tobacco Use  . Smoking status: Former Smoker    Last attempt to quit: 07/21/1968      Years since quitting: 49.6  . Smokeless tobacco: Never Used  Substance Use Topics  . Alcohol use: Yes    Comment: rarely wine  . Drug use: No     Colonoscopy: May 2018; Nandigam  PAP:  Bone density:   Allergies  Allergen Reactions  . Ciprofloxacin Swelling and Rash    other  . Flagyl [Metronidazole] Swelling  . Celecoxib Swelling  . Diazepam Other (See Comments)    dpression  . Hydrocodone Other (See Comments)    Could not sleep  . Oxycodone Other (See Comments)    Restless  . Sulfa Antibiotics Rash  . Sulfonamide Derivatives Rash    Current Outpatient Medications  Medication Sig Dispense Refill  . aspirin 81 MG tablet Take 81 mg by mouth 2 (two) times a week.      . cholecalciferol (VITAMIN D) 1000 UNITS tablet Take 1,000 Units by mouth daily.      Marland Kitchen exemestane (AROMASIN) 25 MG tablet Take 1 tablet (25 mg total) by mouth daily after breakfast. 90 tablet 4  . glucose blood test strip 1 each by Other route as needed for other. Use as instructed    . Ibuprofen (ADVIL) 200 MG  CAPS Take 2 capsules by mouth as needed.    Marland Kitchen lisinopril (PRINIVIL,ZESTRIL) 10 MG tablet Take 10 mg by mouth daily.      . metFORMIN (GLUCOPHAGE) 500 MG tablet Take 500 mg by mouth daily after supper.      . ONE TOUCH LANCETS MISC by Does not apply route.    . polyethylene glycol powder (GLYCOLAX/MIRALAX) powder Take 1 dose in 8oz of fluid x 7 (Patient taking differently: Take 1 dose in 8oz of fluid x 7) 255 g 0  . pravastatin (PRAVACHOL) 10 MG tablet Take 10 mg by mouth daily.      . Probiotic Product (ALIGN PO) Take 1 capsule by mouth daily.    . vitamin B-12 (CYANOCOBALAMIN) 1000 MCG tablet Take 1,000 mcg by mouth daily.     Current Facility-Administered Medications  Medication Dose Route Frequency Provider Last Rate Last Dose  . 0.9 %  sodium chloride infusion  500 mL Intravenous Continuous Nandigam, Venia Minks, MD        OBJECTIVE: Middle-aged white woman in no acute distress  Vitals:    03/04/18 0941  BP: (!) 169/84  Pulse: 71  Resp: 17  Temp: 98.5 F (36.9 C)  SpO2: 98%     Body mass index is 38.35 kg/m.   Wt Readings from Last 3 Encounters:  03/04/18 209 lb 11.2 oz (95.1 kg)  09/28/17 207 lb (93.9 kg)  07/27/17 207 lb (93.9 kg)      ECOG FS:1 - Symptomatic but completely ambulatory  Sclerae unicteric, pupils round and equal No cervical or supraclavicular adenopathy Lungs no rales or rhonchi Heart regular rate and rhythm Abd soft, obese, nontender, positive bowel sounds MSK no focal spinal tenderness, no upper extremity lymphedema Neuro: nonfocal, well oriented, appropriate affect Breasts: The right breast has undergone lumpectomy followed by radiation.  There is no evidence of local recurrence.  Left breast is benign.  Both axillae are benign.  LAB RESULTS:  CMP     Component Value Date/Time   NA 140 03/01/2018 1026   NA 139 03/17/2017 1524   K 4.8 03/01/2018 1026   K 4.2 03/17/2017 1524   CL 108 03/01/2018 1026   CO2 23 03/01/2018 1026   CO2 23 03/17/2017 1524   GLUCOSE 101 (H) 03/01/2018 1026   GLUCOSE 102 03/17/2017 1524   BUN 27 (H) 03/01/2018 1026   BUN 28.6 (H) 03/17/2017 1524   CREATININE 1.01 (H) 03/01/2018 1026   CREATININE 0.9 03/17/2017 1524   CALCIUM 9.7 03/01/2018 1026   CALCIUM 9.8 03/17/2017 1524   PROT 7.0 03/01/2018 1026   PROT 6.8 03/17/2017 1524   ALBUMIN 3.6 03/01/2018 1026   ALBUMIN 3.8 03/17/2017 1524   AST 16 03/01/2018 1026   AST 15 03/17/2017 1524   ALT 17 03/01/2018 1026   ALT 16 03/17/2017 1524   ALKPHOS 90 03/01/2018 1026   ALKPHOS 88 03/17/2017 1524   BILITOT 0.4 03/01/2018 1026   BILITOT 0.31 03/17/2017 1524   GFRNONAA 54 (L) 03/01/2018 1026   GFRAA >60 03/01/2018 1026    No results found for: TOTALPROTELP, ALBUMINELP, A1GS, A2GS, BETS, BETA2SER, GAMS, MSPIKE, SPEI  No results found for: KPAFRELGTCHN, LAMBDASER, KAPLAMBRATIO  Lab Results  Component Value Date   WBC 7.9 03/01/2018   NEUTROABS 4.5  03/01/2018   HGB 12.2 03/01/2018   HCT 37.8 03/01/2018   MCV 94.7 03/01/2018   PLT 176 03/01/2018      Chemistry      Component Value Date/Time  NA 140 03/01/2018 1026   NA 139 03/17/2017 1524   K 4.8 03/01/2018 1026   K 4.2 03/17/2017 1524   CL 108 03/01/2018 1026   CO2 23 03/01/2018 1026   CO2 23 03/17/2017 1524   BUN 27 (H) 03/01/2018 1026   BUN 28.6 (H) 03/17/2017 1524   CREATININE 1.01 (H) 03/01/2018 1026   CREATININE 0.9 03/17/2017 1524      Component Value Date/Time   CALCIUM 9.7 03/01/2018 1026   CALCIUM 9.8 03/17/2017 1524   ALKPHOS 90 03/01/2018 1026   ALKPHOS 88 03/17/2017 1524   AST 16 03/01/2018 1026   AST 15 03/17/2017 1524   ALT 17 03/01/2018 1026   ALT 16 03/17/2017 1524   BILITOT 0.4 03/01/2018 1026   BILITOT 0.31 03/17/2017 1524       No results found for: LABCA2  No components found for: [id number]  No results for input(s): INR in the last 168 hours.  No results found for: LABCA2  No results found for: XBM841  No results found for: LKG401  No results found for: UUV253  No results found for: CA2729  No components found for: HGQUANT  No results found for: CEA1 / No results found for: CEA1   No results found for: AFPTUMOR  No results found for: CHROMOGRNA  No results found for: PSA1  No visits with results within 3 Day(s) from this visit.  Latest known visit with results is:  Appointment on 03/01/2018  Component Date Value Ref Range Status  . Vit D, 25-Hydroxy 03/01/2018 34.1  30.0 - 100.0 ng/mL Final   Comment: (NOTE) Vitamin D deficiency has been defined by the [place] practice guideline as a level of serum 25-OH vitamin D less than 20 ng/mL (1,2). The Endocrine Society went on to further define vitamin D insufficiency as a level between 21 and 29 ng/mL (2). 1. IOM (Institute of Medicine). 2010. Dietary reference   intakes for calcium and D. [place]: The   [employer]. 2. Holick MF, Binkley [place], Bischoff-Ferrari HA, et al.   Evaluation, treatment, and prevention of vitamin D   deficiency: an Endocrine Society clinical practice   guideline. JCEM. 2011 Jul; 96(7):1911-30. Performed At: [facility] [place], [place] [id number] Rush Farmer MD QV:[id number]   . Sodium 03/01/2018 140  135 - 145 mmol/L Final  . Potassium 03/01/2018 4.8  3.5 - 5.1 mmol/L Final  . Chloride 03/01/2018 108  98 - 111 mmol/L Final  . CO2 03/01/2018 23  22 - 32 mmol/L Final  . Glucose, Bld 03/01/2018 101* 70 - 99 mg/dL Final  . BUN 03/01/2018 27* 8 - 23 mg/dL Final  . Creatinine, Ser 03/01/2018 1.01* 0.44 - 1.00 mg/dL Final  . Calcium 03/01/2018 9.7  8.9 - 10.3 mg/dL Final  . Total Protein 03/01/2018 7.0  6.5 - 8.1 g/dL Final  . Albumin 03/01/2018 3.6  3.5 - 5.0 g/dL Final  . AST 03/01/2018 16  15 - 41 U/L Final  . ALT 03/01/2018 17  0 - 44 U/L Final  . Alkaline Phosphatase 03/01/2018 90  38 - 126 U/L Final  . Total Bilirubin 03/01/2018 0.4  0.3 - 1.2 mg/dL Final  . GFR calc non Af Amer 03/01/2018 54* >60 mL/min Final  . GFR calc Af Amer 03/01/2018 >60  >60 mL/min Final   Comment: (NOTE) The eGFR has been calculated using the CKD EPI equation. This calculation has not been validated in  all clinical situations. eGFR's persistently <60 mL/min signify possible Chronic Kidney Disease.   Georgiann Hahn gap 03/01/2018 9  5 - 15 Final   Performed at [facility], [place] [address], [place], [place] [address]  . WBC 03/01/2018 7.9  3.9 - 10.3 K/uL Final  . RBC 03/01/2018 3.99  3.70 - 5.45 MIL/uL Final  . Hemoglobin 03/01/2018 12.2  11.6 - 15.9 g/dL Final  . HCT 03/01/2018 37.8  34.8 - 46.6 % Final  . MCV 03/01/2018 94.7  79.5 - 101.0 fL Final  . MCH 03/01/2018 30.6  25.1 - 34.0 pg Final  . MCHC 03/01/2018 32.3  31.5 - 36.0 g/dL Final  . RDW 03/01/2018 14.5  11.2 - 14.5 % Final  . Platelets 03/01/2018 176  145 - 400 K/uL Final  .  Neutrophils Relative % 03/01/2018 57  % Final  . Neutro Abs 03/01/2018 4.5  1.5 - 6.5 K/uL Final  . Lymphocytes Relative 03/01/2018 33  % Final  . Lymphs Abs 03/01/2018 2.6  0.9 - 3.3 K/uL Final  . Monocytes Relative 03/01/2018 8  % Final  . Monocytes Absolute 03/01/2018 0.6  0.1 - 0.9 K/uL Final  . Eosinophils Relative 03/01/2018 2  % Final  . Eosinophils Absolute 03/01/2018 0.2  0.0 - 0.5 K/uL Final  . Basophils Relative 03/01/2018 0  % Final  . Basophils Absolute 03/01/2018 0.0  0.0 - 0.1 K/uL Final   Performed at [facility], [place] [address] [place], [place] [address]    (this displays the last labs from the last 3 days)  No results found for: TOTALPROTELP, ALBUMINELP, A1GS, A2GS, BETS, BETA2SER, GAMS, MSPIKE, SPEI (this displays SPEP labs)  No results found for: KPAFRELGTCHN, LAMBDASER, KAPLAMBRATIO (kappa/lambda light chains)  No results found for: HGBA, HGBA2QUANT, HGBFQUANT, HGBSQUAN (Hemoglobinopathy evaluation)   No results found for: LDH  No results found for: IRON, TIBC, IRONPCTSAT (Iron and TIBC)  No results found for: FERRITIN  Urinalysis No results found for: COLORURINE, APPEARANCEUR, LABSPEC, PHURINE, GLUCOSEU, HGBUR, BILIRUBINUR, KETONESUR, PROTEINUR, UROBILINOGEN, NITRITE, LEUKOCYTESUR   STUDIES:  No mammographic or sonographic evidence of malignancy.  Breast composition: B - Scattered fibroglandular density.  BI-RADS Category: 2 - Benign findings. Recommend routine follow-up.  RECOMMENDATION: M - Follow-up Mammogram in 1 Year or at the referring 44 discretion. The patient is due for screening left breast mammogram in May 2019. Findings and recommendations were discussed with the patient by Dr. Manuella Ghazi in person at the time of exam.  [facility] law now requires that mammography facilities inform patients of their breast density and that higher density may increase their risk of cancer and may decrease sensitivity of  mammography. Also, if heterogeneously dense or extremely dense, they should talk with their doctor about alternative screening strategies. The following website has been developed to assist patients and physicians: [email or web address].[email or web address]/breast-health.[email or web address]. The patient lay summary letter includes their breast density, a link to the density website, and for patients with dense breasts, a recommendation to talk with their doctor.  Result Narrative  BREAST IMAGING CONSULTATION  RIGHT MAMMOGRAM AND RIGHT BREAST ULTRASOUND, 09/04/2017 9:20 AM  INDICATION: BREAST CANCER \ \ C50.911 Invasive lobular carcinoma of right breast, stage 1 (HCC)   COMPARISON: Multiple priors.  TECHNIQUE: CC and MLO views were performed of the right breast with digital technique and computer-aided detection. The examination was supplemented with 3D tomosynthesis. Additionally, spot magnification views over the right lumpectomy were also performed. Targeted ultrasound of the right breast  was also performed.  FINDINGS:   Mammographically, there is a round equal density mass with circumscribed margins in the subareolar right breast, anterior third. Interval post lumpectomy changes in the right breast. This will serve as a new baseline for future comparison.  On physical examination, no palpable abnormality to correlate with mammographic findings in the subareolar right breast. Scar is noted in the inner central left breast from recent lumpectomy. No suspicious overlying skin changes.  On targeted ultrasound, a rounded anechoic mass with tubular communications is imaged at 3:00 at the nipple corresponding to mammographic findings, most compatible with a vessel, which was likely seen on end mammographically.  Other Result Information  Interface, Rad Results In - 09/04/2017 10:26 AM EST BREAST IMAGING CONSULTATION  RIGHT MAMMOGRAM AND RIGHT BREAST ULTRASOUND, 09/04/2017 9:20 AM  INDICATION: BREAST CANCER \ \ C50.911 Invasive lobular carcinoma  of right breast, stage 1 (HCC)   COMPARISON: Multiple priors.  TECHNIQUE: CC and MLO views were performed of the right breast with digital technique and computer-aided detection. The examination was supplemented with 3D tomosynthesis. Additionally, spot magnification views over the right lumpectomy were also performed. Targeted ultrasound of the right breast was also performed.  FINDINGS:   Mammographically, there is a round equal density mass with circumscribed margins in the subareolar right breast, anterior third. Interval post lumpectomy changes in the right breast. This will serve as a new baseline for future comparison.  On physical examination, no palpable abnormality to correlate with mammographic findings in the subareolar right breast. Scar is noted in the inner central left breast from recent lumpectomy. No suspicious overlying skin changes.  On targeted ultrasound, a rounded anechoic mass with tubular communications is imaged at 3:00 at the nipple corresponding to mammographic findings, most compatible with a vessel, which was likely seen on end mammographically.  CONCLUSION:  No mammographic or sonographic evidence of malignancy.  Breast composition: B - Scattered fibroglandular density.  BI-RADS Category: 2 - Benign findings. Recommend routine follow-up.  RECOMMENDATION: M - Follow-up Mammogram in 1 Year or at the referring 93 discretion. The patient is due for screening left breast mammogram in May 2019. Findings and recommendations were discussed with the patient by Dr. Manuella Ghazi in person at the time of exam.  [facility] law now requires that mammography facilities inform patients of their breast density and that higher density may increase their risk of cancer and may decrease sensitivity of mammography. Also, if heterogeneously dense or extremely dense, they should talk with their doctor about alternative screening strategies. The following website has been  developed to assist patients and physicians: [email or web address].org/breast-health.php. The patient lay summary letter includes their breast density, a link to the density website, and for patients with dense breasts, a recommendation to talk with their doctor.  Status Results Details       ELIGIBLE FOR AVAILABLE RESEARCH PROTOCOL: no  ASSESSMENT: 72 y.o. [place], [place] woman status post right lumpectomy 02/20/2017 for 2 separate areas of invasive lobular carcinoma, grade 2, only one addressed in the pathology report, that one being pT1a pN0, stage IA, estrogen and progesterone receptor strongly positive, HER-2 not amplified  (a) the second area of known invasive lobular carcinoma was also included in the surgical sample; its dimensions and margins are to be addressed in a pathology addendum  (1) no Oncotype not needed and no chemotherapy is warranted  (2) adjuvant radiation4256 to the right breast completed 05/25/17  (3) anastrozole started 07/27/2017, switched to exemestane April 2019  (a) bone  density at [facility] 11/27/2015 shows a T score of +0.4  (4) per patient, she carries a heterozygous MutYH mutation; genetics report has been requested  PLAN: Shenea has a variety of symptoms which I do not think are going to be related to the exemestane.  I think the itching she had in her feet and hands was probably a sun related dermatitis.  The weight gain she is experiencing is due to diet and she needs to stop carbohydrates and increase her exercise.  She understands that diet accounts for 80 to 85% of weight change.  There is good data that antiestrogens do not cause weight gain.  She does have some itching on her scalp and that could be related to low estrogen levels which can cause dry skin.  Exemestane and an antiestrogen can also cause thinning hair.  I think she is not going to be able to tell what is going on unless she goes off the exemestane for a couple of months.  That accordingly is what we will  do.  She will stop it now and she will call us on October 15 to tell us how she is doing.  If she is doing considerably better we will consider switching her to a different agent, but if she is really no better then we will resume the exemestane.  Note that she is obtaining it at quite a good price  She will have mammography and hopefully also a bone density tomorrow.  She will see me again in March 2020.  She knows to call for any issues that may develop before that visit.   Magrinat, Virgie Dad, MD  03/04/18 10:13 AM Medical Oncology and Hematology [facility] [address] [place], [place] [address] Tel. [phone]    Fax. [phone]  Alice Rieger, am acting as scribe for Chauncey Cruel MD.  I, Lurline Del MD, have reviewed the above documentation for accuracy and completeness, and I agree with the above.

## 2018-03-04 ENCOUNTER — Telehealth: Payer: Self-pay | Admitting: Oncology

## 2018-03-04 ENCOUNTER — Inpatient Hospital Stay (HOSPITAL_BASED_OUTPATIENT_CLINIC_OR_DEPARTMENT_OTHER): Payer: PPO | Admitting: Oncology

## 2018-03-04 VITALS — BP 169/84 | HR 71 | Temp 98.5°F | Resp 17 | Ht 62.0 in | Wt 209.7 lb

## 2018-03-04 DIAGNOSIS — R635 Abnormal weight gain: Secondary | ICD-10-CM | POA: Diagnosis not present

## 2018-03-04 DIAGNOSIS — M8580 Other specified disorders of bone density and structure, unspecified site: Secondary | ICD-10-CM | POA: Diagnosis not present

## 2018-03-04 DIAGNOSIS — Z923 Personal history of irradiation: Secondary | ICD-10-CM

## 2018-03-04 DIAGNOSIS — Z794 Long term (current) use of insulin: Secondary | ICD-10-CM

## 2018-03-04 DIAGNOSIS — Z7982 Long term (current) use of aspirin: Secondary | ICD-10-CM

## 2018-03-04 DIAGNOSIS — Z79899 Other long term (current) drug therapy: Secondary | ICD-10-CM

## 2018-03-04 DIAGNOSIS — N951 Menopausal and female climacteric states: Secondary | ICD-10-CM | POA: Diagnosis not present

## 2018-03-04 DIAGNOSIS — Z87891 Personal history of nicotine dependence: Secondary | ICD-10-CM | POA: Diagnosis not present

## 2018-03-04 DIAGNOSIS — E119 Type 2 diabetes mellitus without complications: Secondary | ICD-10-CM | POA: Diagnosis not present

## 2018-03-04 DIAGNOSIS — Z79811 Long term (current) use of aromatase inhibitors: Secondary | ICD-10-CM | POA: Diagnosis not present

## 2018-03-04 DIAGNOSIS — Z801 Family history of malignant neoplasm of trachea, bronchus and lung: Secondary | ICD-10-CM

## 2018-03-04 DIAGNOSIS — Z800 Family history of malignant neoplasm of digestive organs: Secondary | ICD-10-CM

## 2018-03-04 DIAGNOSIS — Z170 Estrogen receptor positive status [ER+]: Secondary | ICD-10-CM | POA: Diagnosis not present

## 2018-03-04 DIAGNOSIS — C50811 Malignant neoplasm of overlapping sites of right female breast: Secondary | ICD-10-CM | POA: Diagnosis not present

## 2018-03-04 NOTE — Telephone Encounter (Signed)
Gave patient avs and calendar.   °

## 2018-03-05 DIAGNOSIS — Z170 Estrogen receptor positive status [ER+]: Secondary | ICD-10-CM | POA: Diagnosis not present

## 2018-03-05 DIAGNOSIS — Z885 Allergy status to narcotic agent status: Secondary | ICD-10-CM | POA: Diagnosis not present

## 2018-03-05 DIAGNOSIS — C50411 Malignant neoplasm of upper-outer quadrant of right female breast: Secondary | ICD-10-CM | POA: Diagnosis not present

## 2018-03-05 DIAGNOSIS — Z9889 Other specified postprocedural states: Secondary | ICD-10-CM | POA: Diagnosis not present

## 2018-03-05 DIAGNOSIS — Z923 Personal history of irradiation: Secondary | ICD-10-CM | POA: Diagnosis not present

## 2018-03-05 DIAGNOSIS — Z886 Allergy status to analgesic agent status: Secondary | ICD-10-CM | POA: Diagnosis not present

## 2018-03-05 DIAGNOSIS — Z882 Allergy status to sulfonamides status: Secondary | ICD-10-CM | POA: Diagnosis not present

## 2018-03-05 DIAGNOSIS — Z888 Allergy status to other drugs, medicaments and biological substances status: Secondary | ICD-10-CM | POA: Diagnosis not present

## 2018-03-05 DIAGNOSIS — Z881 Allergy status to other antibiotic agents status: Secondary | ICD-10-CM | POA: Diagnosis not present

## 2018-03-05 DIAGNOSIS — Z79811 Long term (current) use of aromatase inhibitors: Secondary | ICD-10-CM | POA: Diagnosis not present

## 2018-03-29 DIAGNOSIS — H02834 Dermatochalasis of left upper eyelid: Secondary | ICD-10-CM | POA: Diagnosis not present

## 2018-03-29 DIAGNOSIS — Z961 Presence of intraocular lens: Secondary | ICD-10-CM | POA: Diagnosis not present

## 2018-03-29 DIAGNOSIS — H25041 Posterior subcapsular polar age-related cataract, right eye: Secondary | ICD-10-CM | POA: Diagnosis not present

## 2018-03-29 DIAGNOSIS — Z7984 Long term (current) use of oral hypoglycemic drugs: Secondary | ICD-10-CM | POA: Diagnosis not present

## 2018-03-29 DIAGNOSIS — H5213 Myopia, bilateral: Secondary | ICD-10-CM | POA: Diagnosis not present

## 2018-03-29 DIAGNOSIS — H25011 Cortical age-related cataract, right eye: Secondary | ICD-10-CM | POA: Diagnosis not present

## 2018-03-29 DIAGNOSIS — H2511 Age-related nuclear cataract, right eye: Secondary | ICD-10-CM | POA: Diagnosis not present

## 2018-03-29 DIAGNOSIS — H524 Presbyopia: Secondary | ICD-10-CM | POA: Diagnosis not present

## 2018-03-29 DIAGNOSIS — D3132 Benign neoplasm of left choroid: Secondary | ICD-10-CM | POA: Diagnosis not present

## 2018-03-29 DIAGNOSIS — E119 Type 2 diabetes mellitus without complications: Secondary | ICD-10-CM | POA: Diagnosis not present

## 2018-03-29 DIAGNOSIS — H52203 Unspecified astigmatism, bilateral: Secondary | ICD-10-CM | POA: Diagnosis not present

## 2018-03-29 DIAGNOSIS — H02831 Dermatochalasis of right upper eyelid: Secondary | ICD-10-CM | POA: Diagnosis not present

## 2018-04-26 DIAGNOSIS — Z780 Asymptomatic menopausal state: Secondary | ICD-10-CM | POA: Diagnosis not present

## 2018-04-26 DIAGNOSIS — M8589 Other specified disorders of bone density and structure, multiple sites: Secondary | ICD-10-CM | POA: Diagnosis not present

## 2018-04-27 DIAGNOSIS — C50911 Malignant neoplasm of unspecified site of right female breast: Secondary | ICD-10-CM | POA: Diagnosis not present

## 2018-05-11 ENCOUNTER — Telehealth: Payer: Self-pay | Admitting: *Deleted

## 2018-05-11 NOTE — Telephone Encounter (Signed)
Received vm call from pt stating she is calling regarding medication.  Returned call & she states that she was calling at Dr Magrinat's request regarding symptom of scalp itching after being on exemestane.  She stopped it along with diflunisal.  She thought these were drying her skin too much.  She couldn't get an appt with the dermatologist until this thurs.  Instructed to call back after dermatology appt.  She states she is feeling better although scalp itches periodically but not as acute.  She has been concerned about her BP being a little high & states that husband was in the hospital recently for bleeding ulcer but is OK now.  Message to DR Felsenthal.

## 2018-05-13 DIAGNOSIS — E669 Obesity, unspecified: Secondary | ICD-10-CM | POA: Diagnosis not present

## 2018-05-13 DIAGNOSIS — Z0000 Encounter for general adult medical examination without abnormal findings: Secondary | ICD-10-CM | POA: Diagnosis not present

## 2018-05-13 DIAGNOSIS — Z85828 Personal history of other malignant neoplasm of skin: Secondary | ICD-10-CM | POA: Diagnosis not present

## 2018-05-13 DIAGNOSIS — Z23 Encounter for immunization: Secondary | ICD-10-CM | POA: Diagnosis not present

## 2018-05-13 DIAGNOSIS — K5790 Diverticulosis of intestine, part unspecified, without perforation or abscess without bleeding: Secondary | ICD-10-CM | POA: Diagnosis not present

## 2018-05-13 DIAGNOSIS — Z08 Encounter for follow-up examination after completed treatment for malignant neoplasm: Secondary | ICD-10-CM | POA: Diagnosis not present

## 2018-05-13 DIAGNOSIS — G4733 Obstructive sleep apnea (adult) (pediatric): Secondary | ICD-10-CM | POA: Diagnosis not present

## 2018-05-13 DIAGNOSIS — K760 Fatty (change of) liver, not elsewhere classified: Secondary | ICD-10-CM | POA: Diagnosis not present

## 2018-05-13 DIAGNOSIS — E7849 Other hyperlipidemia: Secondary | ICD-10-CM | POA: Diagnosis not present

## 2018-05-13 DIAGNOSIS — Z9989 Dependence on other enabling machines and devices: Secondary | ICD-10-CM | POA: Diagnosis not present

## 2018-05-13 DIAGNOSIS — I10 Essential (primary) hypertension: Secondary | ICD-10-CM | POA: Diagnosis not present

## 2018-05-13 DIAGNOSIS — C50411 Malignant neoplasm of upper-outer quadrant of right female breast: Secondary | ICD-10-CM | POA: Diagnosis not present

## 2018-05-13 DIAGNOSIS — L218 Other seborrheic dermatitis: Secondary | ICD-10-CM | POA: Diagnosis not present

## 2018-05-13 DIAGNOSIS — Z170 Estrogen receptor positive status [ER+]: Secondary | ICD-10-CM | POA: Diagnosis not present

## 2018-05-13 DIAGNOSIS — E119 Type 2 diabetes mellitus without complications: Secondary | ICD-10-CM | POA: Diagnosis not present

## 2018-05-13 DIAGNOSIS — L821 Other seborrheic keratosis: Secondary | ICD-10-CM | POA: Diagnosis not present

## 2018-05-13 DIAGNOSIS — L570 Actinic keratosis: Secondary | ICD-10-CM | POA: Diagnosis not present

## 2018-05-18 DIAGNOSIS — H2511 Age-related nuclear cataract, right eye: Secondary | ICD-10-CM | POA: Diagnosis not present

## 2018-05-18 DIAGNOSIS — H25011 Cortical age-related cataract, right eye: Secondary | ICD-10-CM | POA: Diagnosis not present

## 2018-05-18 DIAGNOSIS — H524 Presbyopia: Secondary | ICD-10-CM | POA: Diagnosis not present

## 2018-05-18 DIAGNOSIS — H25041 Posterior subcapsular polar age-related cataract, right eye: Secondary | ICD-10-CM | POA: Diagnosis not present

## 2018-05-18 DIAGNOSIS — H52203 Unspecified astigmatism, bilateral: Secondary | ICD-10-CM | POA: Diagnosis not present

## 2018-05-18 DIAGNOSIS — H25811 Combined forms of age-related cataract, right eye: Secondary | ICD-10-CM | POA: Diagnosis not present

## 2018-05-18 DIAGNOSIS — H5213 Myopia, bilateral: Secondary | ICD-10-CM | POA: Diagnosis not present

## 2018-05-25 DIAGNOSIS — Z23 Encounter for immunization: Secondary | ICD-10-CM | POA: Diagnosis not present

## 2018-05-26 ENCOUNTER — Telehealth: Payer: Self-pay | Admitting: *Deleted

## 2018-05-26 NOTE — Telephone Encounter (Signed)
This RN spoke with pt per her VM today.  Diane Macias states she stopped both the exemestane and dolobid due to onset of itching- which was evaluated by her dermatologist with clearance to restart the medication.  Per discussion - plan is for pt to restart the exemestane 1 tab every other day x 1 week then if no itching occurs she can resume 1 tablet a day.  She should call us later in the month with update and any body discomfort/arthralgias and possible restart of the anti inflammatory or other medication.  Diane Macias is in agreement to plan.  This note will be sent to MD for review- no further needs at this time.

## 2018-05-28 DIAGNOSIS — G4733 Obstructive sleep apnea (adult) (pediatric): Secondary | ICD-10-CM | POA: Diagnosis not present

## 2018-09-07 DIAGNOSIS — Z170 Estrogen receptor positive status [ER+]: Secondary | ICD-10-CM | POA: Diagnosis not present

## 2018-09-07 DIAGNOSIS — C50411 Malignant neoplasm of upper-outer quadrant of right female breast: Secondary | ICD-10-CM | POA: Diagnosis not present

## 2018-09-22 DIAGNOSIS — M25511 Pain in right shoulder: Secondary | ICD-10-CM | POA: Diagnosis not present

## 2018-09-22 DIAGNOSIS — M170 Bilateral primary osteoarthritis of knee: Secondary | ICD-10-CM | POA: Diagnosis not present

## 2018-09-22 DIAGNOSIS — M1811 Unilateral primary osteoarthritis of first carpometacarpal joint, right hand: Secondary | ICD-10-CM | POA: Diagnosis not present

## 2018-10-07 ENCOUNTER — Other Ambulatory Visit: Payer: PPO

## 2018-10-07 ENCOUNTER — Ambulatory Visit: Payer: PPO | Admitting: Oncology

## 2018-11-18 DIAGNOSIS — E119 Type 2 diabetes mellitus without complications: Secondary | ICD-10-CM | POA: Diagnosis not present

## 2018-11-18 DIAGNOSIS — M47816 Spondylosis without myelopathy or radiculopathy, lumbar region: Secondary | ICD-10-CM | POA: Diagnosis not present

## 2018-11-18 DIAGNOSIS — K5790 Diverticulosis of intestine, part unspecified, without perforation or abscess without bleeding: Secondary | ICD-10-CM | POA: Diagnosis not present

## 2018-11-18 DIAGNOSIS — E7849 Other hyperlipidemia: Secondary | ICD-10-CM | POA: Diagnosis not present

## 2018-11-18 DIAGNOSIS — I10 Essential (primary) hypertension: Secondary | ICD-10-CM | POA: Diagnosis not present

## 2018-11-18 DIAGNOSIS — Z9989 Dependence on other enabling machines and devices: Secondary | ICD-10-CM | POA: Diagnosis not present

## 2018-11-18 DIAGNOSIS — G4733 Obstructive sleep apnea (adult) (pediatric): Secondary | ICD-10-CM | POA: Diagnosis not present

## 2018-11-18 DIAGNOSIS — K760 Fatty (change of) liver, not elsewhere classified: Secondary | ICD-10-CM | POA: Diagnosis not present

## 2018-11-19 DIAGNOSIS — G4733 Obstructive sleep apnea (adult) (pediatric): Secondary | ICD-10-CM | POA: Diagnosis not present

## 2018-11-24 DIAGNOSIS — H0011 Chalazion right upper eyelid: Secondary | ICD-10-CM | POA: Diagnosis not present

## 2018-11-24 DIAGNOSIS — H1131 Conjunctival hemorrhage, right eye: Secondary | ICD-10-CM | POA: Diagnosis not present

## 2018-11-25 NOTE — Progress Notes (Signed)
[place]  [phone] [phone]     ID: RITIKA HELLICKSON DOB: 08-06-1945  MR#: [id number]  CWU#:[id number]  Patient Care Team: Drake Leach, MD as PCP - General (Internal Medicine) Angelina Ok, MD as Referring Physician (Surgery) Magrinat, Virgie Dad, MD as Consulting Physician (Oncology) Thea Silversmith, MD as Referring Physician (Radiation Oncology) Mauri Pole, MD as Consulting Physician (Gastroenterology) OTHER MD:  I connected with [name] on 11/29/18 at 11:00 AM EDT by video enabled telemedicine visit and verified that I am speaking with the correct person using two identifiers.   I discussed the limitations, risks, security and privacy concerns of performing an evaluation and management service by telemedicine and the availability of in-person appointments. I also discussed with the patient that there may be a patient responsible charge related to this service. The patient expressed understanding and agreed to proceed.   Other persons participating in the visit and their role in the encounter: Wilburn Mylar, scribe   Patient's location: home  Provider's location: [place]    CHIEF COMPLAINT: Estrogen receptor positive lobular breast cancer   CURRENT TREATMENT: Exemestane   INTERVAL HISTORY: Hedaya returns today for follow up and treatment of her estrogen receptor positive lobular breast cancer.   She stopped exemestane in October because of scalp itching.  She resumed it mid November after discussion with her dermatologist. She continues on exemestane, with fair tolerance. She reports occasional night sweats that have improved. She also reports vaginal dryness, "but I just live with it." She states she pays $30 for a 90-day supply.  Since her last visit, she underwent bone density testing on 04/26/2018 at [facility]. This showed a T-score of -1.5, which is considered osteopenic.  She also  underwent diagnostic right mammogram on 09/07/2018 at [facility], showing: breast density category B; no mammographic evidence of malignancy. She will undergo bilateral mammogram in August 2020.   REVIEW OF SYSTEMS: Yatzil reports she is working full time at home. Since her last visit, she underwent right eye cataract extraction on 05/18/2018 under Dr. Amalia Hailey. She reports walking with her sisters for exercise 3-4 days a week; they go to a local church parking lot to walk. The patient denies unusual headaches, visual changes, nausea, vomiting, stiff neck, dizziness, or gait imbalance. There has been no cough, phlegm production, or pleurisy, no chest pain or pressure, and no change in bowel or bladder habits. The patient denies fever, rash, bleeding, unexplained fatigue or unexplained weight loss. A detailed review of systems was otherwise entirely negative.   HISTORY OF CURRENT ILLNESS: From the original intake note:  Honour had bilateral screening mammography at [facility] 11/30/2014 showing the breast density to be category B. There was a possible mass in the right breast. On 12/10/2016 she underwent right diagnostic mammography with tomography and right breast ultrasonography. This confirmed a 0.4 cm spiculated mass in the periareolar area of the right breast. Ultrasound showed a small irregular hypoechoic mass at the 9:00 position 1 cm from the nipple, measuring 0.5 cm. Ultrasound of the right axilla was benign.  Biopsy of the right breast area in question 12/25/2016 showed an invasive lobular carcinoma, grade 2, estrogen receptor positive at 85%, progesterone receptor positive at 65%, both with strong staining intensity, and HER-2 not amplified by immunohistochemistry (1+).   On 01/19/2017 she underwent bilateral breast MRIs which showed in addition to the known right breast mass an additional area of concern in the central right breast.  This measured 0.6 cm. MRI guided biopsy 02/06/2017 confirmed that  this second area also was invasive lobular carcinoma, grade 2. It was located (by MRI) 1.5 cm inferior medial to the previously biopsied mass. Also there was a 0.6 mm sternal lesion in the inferior aspect of the sternum.  Because of the sternal lesion on the patient underwent PET scanning 02/12/2017. This was entirely negative and the sternal lesion is felt to be most consistent with a bone island  On 02/20/2017 the patient underwent right lumpectomy. This showed ([id number]) invasive lobular carcinoma measuring 0.5 cm, grade 2, with evidence of lobular carcinoma in situ. Margins were clear. Both sentinel lymph nodes were clear. [NB: the second biopsied lesion was present in the surgical sample but not addressed in the report; I have discussed this with pathology and Dr Catha Brow has kindly agreed to review the case, measure this lesion and margins, and dictate an addendum]  The patient's subsequent history is as detailed below.   PAST MEDICAL HISTORY: Past Medical History:  Diagnosis Date  . Adenomatous colon polyp 04/18/2011  . Allergy   . Arthritis   . Cancer ([place]) 1997   squamous and basal cell  . Diabetes mellitus    type II  . Hyperlipidemia   . Hypertension   . Osteopenia   . Sleep apnea    CPAP    PAST SURGICAL HISTORY: Past Surgical History:  Procedure Laterality Date  . arthroscopic knee  01/2008   right knee  . CATARACT EXTRACTION Left 2016  . CHOLECYSTECTOMY  1981  . COLONOSCOPY    . FOOT SURGERY  07/2007   right foot (BUNION)  . KNEE ARTHROSCOPY     left  . TONSILLECTOMY  1953  . torn maniscus  11/2010   left knee  . VAGINAL HYSTERECTOMY  1994    FAMILY HISTORY Family History  Problem Relation Age of Onset  . Lung cancer Father 19  . Colon cancer Neg Hx   The patient's father died from lung cancer in the setting of tobacco abuse. He was 73 years old. The patient's mother died at 61 with Alzheimer's disease. The patient had no brothers, 2 sisters. One sister was  diagnosed with breast cancer at age 44. There is also a maternal cousin with breast cancer. There is no history of ovarian or prostate cancer in the family. The patient was genetically tested at [facility] and reportedly carries a heterozygous MutYH mutation. I do have requested that report  GYNECOLOGIC HISTORY:  No LMP recorded. Patient has had a hysterectomy. Menarche age 38, first live birth age 39, the patient is [place] P2. She underwent total abdominal hysterectomy with bilateral salpingo-oophorectomy approximately age 47. She took hormone replacement for approximately 17 years.  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 [name] is a retired Scientist, water quality. Daughter [name] is a Risk manager in [place]. Daughter [name] is Web designer at the country day school locally. The patient has 3 grandchildren. She is a Tourist information centre manager.    ADVANCED DIRECTIVES:    HEALTH MAINTENANCE: Social History   Tobacco Use  . Smoking status: Former Smoker    Last attempt to quit: 07/21/1968    Years since quitting: 50.3  . Smokeless tobacco: Never Used  Substance Use Topics  . Alcohol use: Yes    Comment: rarely wine  . Drug use: No     Colonoscopy: May 2018; Nandigam  PAP:  Bone density:   Allergies  Allergen  Reactions  . Ciprofloxacin Swelling and Rash    other  . Flagyl [Metronidazole] Swelling  . Celecoxib Swelling  . Diazepam Other (See Comments)    dpression  . Hydrocodone Other (See Comments)    Could not sleep  . Oxycodone Other (See Comments)    Restless  . Sulfa Antibiotics Rash  . Sulfonamide Derivatives Rash    Current Outpatient Medications  Medication Sig Dispense Refill  . cholecalciferol (VITAMIN D) 1000 UNITS tablet Take 1,000 Units by mouth daily.      . diflunisal (DOLOBID) 500 MG TABS tablet Take 1 tablet (500 mg total) by mouth 2 (two) times daily. 60 tablet   . exemestane (AROMASIN) 25 MG tablet Take 1 tablet (25 mg total) by  mouth daily after breakfast. 90 tablet 4  . glucose blood test strip 1 each by Other route as needed for other. Use as instructed    . lisinopril (PRINIVIL,ZESTRIL) 10 MG tablet Take 10 mg by mouth daily.      . metFORMIN (GLUCOPHAGE) 500 MG tablet Take 500 mg by mouth daily after supper.      . ONE TOUCH LANCETS MISC by Does not apply route.    . pravastatin (PRAVACHOL) 10 MG tablet Take 10 mg by mouth daily.      . Probiotic Product (ALIGN PO) Take 1 capsule by mouth daily.    . vitamin B-12 (CYANOCOBALAMIN) 1000 MCG tablet Take 1,000 mcg by mouth daily.     Current Facility-Administered Medications  Medication Dose Route Frequency Provider Last Rate Last Dose  . 0.9 %  sodium chloride infusion  500 mL Intravenous Continuous Nandigam, Kavitha V, MD        OBJECTIVE: Middle-aged white woman in no acute distress  There were no vitals filed for this visit.   There is no height or weight on file to calculate BMI.   Wt Readings from Last 3 Encounters:  03/04/18 209 lb 11.2 oz (95.1 kg)  09/28/17 207 lb (93.9 kg)  07/27/17 207 lb (93.9 kg)      ECOG FS:1 - Symptomatic but completely ambulatory   LAB RESULTS:  CMP     Component Value Date/Time   NA 140 03/01/2018 1026   NA 139 03/17/2017 1524   K 4.8 03/01/2018 1026   K 4.2 03/17/2017 1524   CL 108 03/01/2018 1026   CO2 23 03/01/2018 1026   CO2 23 03/17/2017 1524   GLUCOSE 101 (H) 03/01/2018 1026   GLUCOSE 102 03/17/2017 1524   BUN 27 (H) 03/01/2018 1026   BUN 28.6 (H) 03/17/2017 1524   CREATININE 1.01 (H) 03/01/2018 1026   CREATININE 0.9 03/17/2017 1524   CALCIUM 9.7 03/01/2018 1026   CALCIUM 9.8 03/17/2017 1524   PROT 7.0 03/01/2018 1026   PROT 6.8 03/17/2017 1524   ALBUMIN 3.6 03/01/2018 1026   ALBUMIN 3.8 03/17/2017 1524   AST 16 03/01/2018 1026   AST 15 03/17/2017 1524   ALT 17 03/01/2018 1026   ALT 16 03/17/2017 1524   ALKPHOS 90 03/01/2018 1026   ALKPHOS 88 03/17/2017 1524   BILITOT 0.4 03/01/2018 1026    BILITOT 0.31 03/17/2017 1524   GFRNONAA 54 (L) 03/01/2018 1026   GFRAA >60 03/01/2018 1026    No results found for: TOTALPROTELP, ALBUMINELP, A1GS, A2GS, BETS, BETA2SER, GAMS, MSPIKE, SPEI  No results found for: KPAFRELGTCHN, LAMBDASER, KAPLAMBRATIO  Lab Results  Component Value Date   WBC 7.9 03/01/2018   NEUTROABS 4.5 03/01/2018   HGB 12.2 03/01/2018  HCT 37.8 03/01/2018   MCV 94.7 03/01/2018   PLT 176 03/01/2018      Chemistry      Component Value Date/Time   NA 140 03/01/2018 1026   NA 139 03/17/2017 1524   K 4.8 03/01/2018 1026   K 4.2 03/17/2017 1524   CL 108 03/01/2018 1026   CO2 23 03/01/2018 1026   CO2 23 03/17/2017 1524   BUN 27 (H) 03/01/2018 1026   BUN 28.6 (H) 03/17/2017 1524   CREATININE 1.01 (H) 03/01/2018 1026   CREATININE 0.9 03/17/2017 1524      Component Value Date/Time   CALCIUM 9.7 03/01/2018 1026   CALCIUM 9.8 03/17/2017 1524   ALKPHOS 90 03/01/2018 1026   ALKPHOS 88 03/17/2017 1524   AST 16 03/01/2018 1026   AST 15 03/17/2017 1524   ALT 17 03/01/2018 1026   ALT 16 03/17/2017 1524   BILITOT 0.4 03/01/2018 1026   BILITOT 0.31 03/17/2017 1524       No results found for: LABCA2  No components found for: QHUTML465  No results for input(s): INR in the last 168 hours.  No results found for: LABCA2  No results found for: KPT465  No results found for: KCL275  No results found for: TZG017  No results found for: CA2729  No components found for: HGQUANT  No results found for: CEA1 / No results found for: CEA1   No results found for: AFPTUMOR  No results found for: CHROMOGRNA  No results found for: PSA1  No visits with results within 3 Day(s) from this visit.  Latest known visit with results is:  Appointment on 03/01/2018  Component Date Value Ref Range Status  . Vit D, 25-Hydroxy 03/01/2018 34.1  30.0 - 100.0 ng/mL Final   Comment: (NOTE) Vitamin D deficiency has been defined by the [place] practice guideline as a level of serum 25-OH vitamin D less than 20 ng/mL (1,2). The Endocrine Society went on to further define vitamin D insufficiency as a level between 21 and 29 ng/mL (2). 1. IOM (Institute of Medicine). 2010. Dietary reference   intakes for calcium and D. Luke: The   [employer]. 2. Holick MF, Binkley [place], Bischoff-Ferrari HA, et al.   Evaluation, treatment, and prevention of vitamin D   deficiency: an Endocrine Society clinical practice   guideline. JCEM. 2011 Jul; 96(7):1911-30. Performed At: [facility] [place], [place] [id number] Rush Farmer MD FM:[id number]   . Sodium 03/01/2018 140  135 - 145 mmol/L Final  . Potassium 03/01/2018 4.8  3.5 - 5.1 mmol/L Final  . Chloride 03/01/2018 108  98 - 111 mmol/L Final  . CO2 03/01/2018 23  22 - 32 mmol/L Final  . Glucose, Bld 03/01/2018 101* 70 - 99 mg/dL Final  . BUN 03/01/2018 27* 8 - 23 mg/dL Final  . Creatinine, Ser 03/01/2018 1.01* 0.44 - 1.00 mg/dL Final  . Calcium 03/01/2018 9.7  8.9 - 10.3 mg/dL Final  . Total Protein 03/01/2018 7.0  6.5 - 8.1 g/dL Final  . Albumin 03/01/2018 3.6  3.5 - 5.0 g/dL Final  . AST 03/01/2018 16  15 - 41 U/L Final  . ALT 03/01/2018 17  0 - 44 U/L Final  . Alkaline Phosphatase 03/01/2018 90  38 - 126 U/L Final  . Total Bilirubin 03/01/2018 0.4  0.3 - 1.2 mg/dL Final  . GFR calc non Af Amer 03/01/2018 54* >60 mL/min Final  . GFR calc  Af Amer 03/01/2018 >60  >60 mL/min Final   Comment: (NOTE) The eGFR has been calculated using the CKD EPI equation. This calculation has not been validated in all clinical situations. eGFR's persistently <60 mL/min signify possible Chronic Kidney Disease.   Georgiann Hahn gap 03/01/2018 9  5 - 15 Final   Performed at [facility], [place] [address], [place], [place] [address]  . WBC 03/01/2018 7.9  3.9 - 10.3 K/uL Final  . RBC 03/01/2018 3.99  3.70 - 5.45 MIL/uL Final  .  Hemoglobin 03/01/2018 12.2  11.6 - 15.9 g/dL Final  . HCT 03/01/2018 37.8  34.8 - 46.6 % Final  . MCV 03/01/2018 94.7  79.5 - 101.0 fL Final  . MCH 03/01/2018 30.6  25.1 - 34.0 pg Final  . MCHC 03/01/2018 32.3  31.5 - 36.0 g/dL Final  . RDW 03/01/2018 14.5  11.2 - 14.5 % Final  . Platelets 03/01/2018 176  145 - 400 K/uL Final  . Neutrophils Relative % 03/01/2018 57  % Final  . Neutro Abs 03/01/2018 4.5  1.5 - 6.5 K/uL Final  . Lymphocytes Relative 03/01/2018 33  % Final  . Lymphs Abs 03/01/2018 2.6  0.9 - 3.3 K/uL Final  . Monocytes Relative 03/01/2018 8  % Final  . Monocytes Absolute 03/01/2018 0.6  0.1 - 0.9 K/uL Final  . Eosinophils Relative 03/01/2018 2  % Final  . Eosinophils Absolute 03/01/2018 0.2  0.0 - 0.5 K/uL Final  . Basophils Relative 03/01/2018 0  % Final  . Basophils Absolute 03/01/2018 0.0  0.0 - 0.1 K/uL Final   Performed at [facility], [place] [address], [place], [place] [address]    (this displays the last labs from the last 3 days)  No results found for: TOTALPROTELP, ALBUMINELP, A1GS, A2GS, BETS, BETA2SER, GAMS, MSPIKE, SPEI (this displays SPEP labs)  No results found for: KPAFRELGTCHN, LAMBDASER, KAPLAMBRATIO (kappa/lambda light chains)  No results found for: HGBA, HGBA2QUANT, HGBFQUANT, HGBSQUAN (Hemoglobinopathy evaluation)   No results found for: LDH  No results found for: IRON, TIBC, IRONPCTSAT (Iron and TIBC)  No results found for: FERRITIN  Urinalysis No results found for: COLORURINE, APPEARANCEUR, LABSPEC, PHURINE, GLUCOSEU, HGBUR, BILIRUBINUR, KETONESUR, PROTEINUR, UROBILINOGEN, NITRITE, LEUKOCYTESUR   STUDIES: Mammography and [facility] density at [facility] 09/07/2018 discussed  ELIGIBLE FOR AVAILABLE RESEARCH PROTOCOL: no  ASSESSMENT: 73 y.o. [place], [place] woman status post right lumpectomy 02/20/2017 for 2 separate areas of invasive lobular carcinoma, grade 2, only one addressed in the pathology report, that one being  pT1a pN0, stage IA, estrogen and progesterone receptor strongly positive, HER-2 not amplified  (a) the second area of known invasive lobular carcinoma was also included in the surgical sample; its dimensions and margins were to be addressed in a pathology addendum  (1) Oncotype not indicated and no chemotherapy is warranted  (2) adjuvant radiation4256 to the right breast completed 05/25/17  (3) anastrozole started 07/27/2017, switched to exemestane April 2019  (a) bone density at [facility] 11/27/2015 shows a T score of +0.4  (b) repeat bone density 04/26/2018 shows a T score of -1.0  (4) per patient, she carries a heterozygous MutYH mutation  PLAN: Ciani is now almost 2 years out from definitive surgery for her breast cancer, with no evidence of disease recurrence.  This is very favorable.  She is tolerating the exemestane well and the plan is to continue that a total of 5 years.  She did lose some bone density  but her DEXA scan remains in the normal range.  Since most of the bone density loss acceleration that occurs with aromatase inhibitors takes place in the first year, this is very favorable.  She continues on vitamin D supplementation and is walking 3-4 times a week  She asked about when she might be able to return to work.  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  She will see Dr. Alvan Dame in August.  She will return to see me in November and we will do lab work at that visit  She knows to call for any other issues that may develop before then.   Magrinat, Virgie Dad, MD  11/29/18 12:14 PM Medical Oncology and Hematology [facility] [address] [place], [place] [address] Tel. [phone]    Fax. [phone]   I, Wilburn Mylar, am acting as scribe for Dr. Virgie Dad.  Magrinat.  I, Lurline Del MD, have reviewed the above documentation for accuracy and completeness, and I agree with the above.

## 2018-11-26 ENCOUNTER — Telehealth: Payer: Self-pay | Admitting: Oncology

## 2018-11-26 NOTE — Telephone Encounter (Signed)
Called regarding upcoming Webex appointment, patient is notified and e-mail sent.

## 2018-11-29 ENCOUNTER — Inpatient Hospital Stay: Payer: PPO | Attending: Oncology | Admitting: Oncology

## 2018-11-29 ENCOUNTER — Other Ambulatory Visit: Payer: PPO

## 2018-11-29 DIAGNOSIS — E119 Type 2 diabetes mellitus without complications: Secondary | ICD-10-CM | POA: Diagnosis not present

## 2018-11-29 DIAGNOSIS — Z79899 Other long term (current) drug therapy: Secondary | ICD-10-CM

## 2018-11-29 DIAGNOSIS — I10 Essential (primary) hypertension: Secondary | ICD-10-CM | POA: Diagnosis not present

## 2018-11-29 DIAGNOSIS — C50811 Malignant neoplasm of overlapping sites of right female breast: Secondary | ICD-10-CM

## 2018-11-29 DIAGNOSIS — Z87891 Personal history of nicotine dependence: Secondary | ICD-10-CM

## 2018-11-29 DIAGNOSIS — Z170 Estrogen receptor positive status [ER+]: Secondary | ICD-10-CM | POA: Diagnosis not present

## 2018-11-30 ENCOUNTER — Telehealth: Payer: Self-pay | Admitting: Oncology

## 2018-11-30 NOTE — Telephone Encounter (Signed)
Tried to reach regarding schedule °

## 2018-12-16 DIAGNOSIS — Z9989 Dependence on other enabling machines and devices: Secondary | ICD-10-CM | POA: Diagnosis not present

## 2018-12-16 DIAGNOSIS — G4733 Obstructive sleep apnea (adult) (pediatric): Secondary | ICD-10-CM | POA: Diagnosis not present

## 2018-12-26 ENCOUNTER — Other Ambulatory Visit: Payer: Self-pay | Admitting: Oncology

## 2019-03-08 DIAGNOSIS — Z79811 Long term (current) use of aromatase inhibitors: Secondary | ICD-10-CM | POA: Diagnosis not present

## 2019-03-08 DIAGNOSIS — Z170 Estrogen receptor positive status [ER+]: Secondary | ICD-10-CM | POA: Diagnosis not present

## 2019-03-08 DIAGNOSIS — C50411 Malignant neoplasm of upper-outer quadrant of right female breast: Secondary | ICD-10-CM | POA: Diagnosis not present

## 2019-03-08 DIAGNOSIS — Z923 Personal history of irradiation: Secondary | ICD-10-CM | POA: Diagnosis not present

## 2019-03-08 DIAGNOSIS — Z9221 Personal history of antineoplastic chemotherapy: Secondary | ICD-10-CM | POA: Diagnosis not present

## 2019-03-08 DIAGNOSIS — Z9011 Acquired absence of right breast and nipple: Secondary | ICD-10-CM | POA: Diagnosis not present

## 2019-03-31 DIAGNOSIS — H02831 Dermatochalasis of right upper eyelid: Secondary | ICD-10-CM | POA: Diagnosis not present

## 2019-03-31 DIAGNOSIS — D3132 Benign neoplasm of left choroid: Secondary | ICD-10-CM | POA: Diagnosis not present

## 2019-03-31 DIAGNOSIS — Z961 Presence of intraocular lens: Secondary | ICD-10-CM | POA: Diagnosis not present

## 2019-03-31 DIAGNOSIS — H02834 Dermatochalasis of left upper eyelid: Secondary | ICD-10-CM | POA: Diagnosis not present

## 2019-03-31 DIAGNOSIS — Z7984 Long term (current) use of oral hypoglycemic drugs: Secondary | ICD-10-CM | POA: Diagnosis not present

## 2019-03-31 DIAGNOSIS — H26492 Other secondary cataract, left eye: Secondary | ICD-10-CM | POA: Diagnosis not present

## 2019-03-31 DIAGNOSIS — H5213 Myopia, bilateral: Secondary | ICD-10-CM | POA: Diagnosis not present

## 2019-03-31 DIAGNOSIS — H524 Presbyopia: Secondary | ICD-10-CM | POA: Diagnosis not present

## 2019-03-31 DIAGNOSIS — H52203 Unspecified astigmatism, bilateral: Secondary | ICD-10-CM | POA: Diagnosis not present

## 2019-03-31 DIAGNOSIS — E119 Type 2 diabetes mellitus without complications: Secondary | ICD-10-CM | POA: Diagnosis not present

## 2019-04-07 DIAGNOSIS — R300 Dysuria: Secondary | ICD-10-CM | POA: Diagnosis not present

## 2019-04-07 DIAGNOSIS — Z23 Encounter for immunization: Secondary | ICD-10-CM | POA: Diagnosis not present

## 2019-04-07 DIAGNOSIS — E119 Type 2 diabetes mellitus without complications: Secondary | ICD-10-CM | POA: Diagnosis not present

## 2019-04-08 DIAGNOSIS — T50905A Adverse effect of unspecified drugs, medicaments and biological substances, initial encounter: Secondary | ICD-10-CM | POA: Diagnosis not present

## 2019-04-08 DIAGNOSIS — R238 Other skin changes: Secondary | ICD-10-CM | POA: Diagnosis not present

## 2019-04-08 DIAGNOSIS — M7989 Other specified soft tissue disorders: Secondary | ICD-10-CM | POA: Diagnosis not present

## 2019-04-15 DIAGNOSIS — R300 Dysuria: Secondary | ICD-10-CM | POA: Diagnosis not present

## 2019-04-21 ENCOUNTER — Other Ambulatory Visit: Payer: Self-pay | Admitting: Oncology

## 2019-04-21 DIAGNOSIS — G4733 Obstructive sleep apnea (adult) (pediatric): Secondary | ICD-10-CM | POA: Diagnosis not present

## 2019-04-28 DIAGNOSIS — H43811 Vitreous degeneration, right eye: Secondary | ICD-10-CM | POA: Diagnosis not present

## 2019-04-28 DIAGNOSIS — H35411 Lattice degeneration of retina, right eye: Secondary | ICD-10-CM | POA: Diagnosis not present

## 2019-05-12 DIAGNOSIS — D239 Other benign neoplasm of skin, unspecified: Secondary | ICD-10-CM | POA: Diagnosis not present

## 2019-05-12 DIAGNOSIS — Z85828 Personal history of other malignant neoplasm of skin: Secondary | ICD-10-CM | POA: Diagnosis not present

## 2019-05-12 DIAGNOSIS — D1801 Hemangioma of skin and subcutaneous tissue: Secondary | ICD-10-CM | POA: Diagnosis not present

## 2019-05-12 DIAGNOSIS — Z08 Encounter for follow-up examination after completed treatment for malignant neoplasm: Secondary | ICD-10-CM | POA: Diagnosis not present

## 2019-05-26 DIAGNOSIS — K5790 Diverticulosis of intestine, part unspecified, without perforation or abscess without bleeding: Secondary | ICD-10-CM | POA: Diagnosis not present

## 2019-05-26 DIAGNOSIS — M19049 Primary osteoarthritis, unspecified hand: Secondary | ICD-10-CM | POA: Diagnosis not present

## 2019-05-26 DIAGNOSIS — E119 Type 2 diabetes mellitus without complications: Secondary | ICD-10-CM | POA: Diagnosis not present

## 2019-05-26 DIAGNOSIS — G4733 Obstructive sleep apnea (adult) (pediatric): Secondary | ICD-10-CM | POA: Diagnosis not present

## 2019-05-26 DIAGNOSIS — I10 Essential (primary) hypertension: Secondary | ICD-10-CM | POA: Diagnosis not present

## 2019-05-26 DIAGNOSIS — E7849 Other hyperlipidemia: Secondary | ICD-10-CM | POA: Diagnosis not present

## 2019-05-26 DIAGNOSIS — K760 Fatty (change of) liver, not elsewhere classified: Secondary | ICD-10-CM | POA: Diagnosis not present

## 2019-05-26 DIAGNOSIS — Z9989 Dependence on other enabling machines and devices: Secondary | ICD-10-CM | POA: Diagnosis not present

## 2019-05-26 DIAGNOSIS — Z0000 Encounter for general adult medical examination without abnormal findings: Secondary | ICD-10-CM | POA: Diagnosis not present

## 2019-06-04 DIAGNOSIS — M19049 Primary osteoarthritis, unspecified hand: Secondary | ICD-10-CM | POA: Diagnosis not present

## 2019-06-04 DIAGNOSIS — M19042 Primary osteoarthritis, left hand: Secondary | ICD-10-CM | POA: Diagnosis not present

## 2019-06-04 DIAGNOSIS — M19041 Primary osteoarthritis, right hand: Secondary | ICD-10-CM | POA: Diagnosis not present

## 2019-06-04 DIAGNOSIS — M79642 Pain in left hand: Secondary | ICD-10-CM | POA: Diagnosis not present

## 2019-06-06 ENCOUNTER — Telehealth: Payer: Self-pay | Admitting: Oncology

## 2019-06-06 DIAGNOSIS — D3132 Benign neoplasm of left choroid: Secondary | ICD-10-CM | POA: Diagnosis not present

## 2019-06-06 DIAGNOSIS — H52203 Unspecified astigmatism, bilateral: Secondary | ICD-10-CM | POA: Diagnosis not present

## 2019-06-06 DIAGNOSIS — H43813 Vitreous degeneration, bilateral: Secondary | ICD-10-CM | POA: Diagnosis not present

## 2019-06-06 DIAGNOSIS — Z961 Presence of intraocular lens: Secondary | ICD-10-CM | POA: Diagnosis not present

## 2019-06-06 DIAGNOSIS — H35443 Age-related reticular degeneration of retina, bilateral: Secondary | ICD-10-CM | POA: Diagnosis not present

## 2019-06-06 DIAGNOSIS — H524 Presbyopia: Secondary | ICD-10-CM | POA: Diagnosis not present

## 2019-06-06 NOTE — Telephone Encounter (Signed)
Called patient regarding upcoming Webex appointment, left a voicemail and e-mail has been sent.  °

## 2019-06-06 NOTE — Progress Notes (Signed)
[place]  [phone] [phone]     ID: Diane LECCESE DOB: 1946-06-15  MR#: [id number]  FHQ#:[id number]  Patient Care Team: Drake Leach, MD as PCP - General (Internal Medicine) Angelina Ok, MD as Referring Physician (Surgery) Toyoko Silos, Virgie Dad, MD as Consulting Physician (Oncology) Thea Silversmith, MD as Referring Physician (Radiation Oncology) Mauri Pole, MD as Consulting Physician (Gastroenterology) OTHER MD:  I connected with [name] on 06/07/19 at 11:00 AM EST by video enabled telemedicine visit and verified that I am speaking with the correct person using two identifiers.   I discussed the limitations, risks, security and privacy concerns of performing an evaluation and management service by telemedicine and the availability of in-person appointments. I also discussed with the patient that there may be a patient responsible charge related to this service. The patient expressed understanding and agreed to proceed.   Other persons participating in the visit and their role in the encounter: None  Patient's location: home  Provider's location: [place]: Estrogen receptor positive lobular breast cancer   CURRENT TREATMENT: Exemestane   INTERVAL HISTORY: Tonianne returns today for follow up of her estrogen receptor positive lobular breast cancer.   She continues on exemestane.  She tolerates this well with no sign effects that she is aware of.  The problem is cost.  She had to pay more than $200 last time for a 84-monthsupply  [facility] -[facility].5, which is considered osteopenic.  [facility]  There was no evidence of malignancy.   REVIEW OF SYSTEMS: MSylverand her husband are taking appropriate pandemic precautions.  She is not exercising  regularly now that it is a bit cold.  She has significant pain in both her hands.  [facility].  She very likely also has bilateral carpal tunnel because of the symptoms she gives me of tightness in her hands in the morning.  Aside from these issues a detailed review of systems was stable   HISTORY OF CURRENT ILLNESS: From the original intake note:  [facility] the [facility] density to be category B. There was a possible mass in the right breast. On 12/10/2016 she underwent right diagnostic mammography with tomography and right breast ultrasonography. This confirmed a 0.4 cm spiculated mass in the periareolar area of the right breast. Ultrasound showed a small irregular hypoechoic mass at the 9:00 position 1 cm from the nipple, measuring 0.5 cm. Ultrasound of the right axilla was benign.  Biopsy of the right breast area in question 12/25/2016 showed an invasive lobular carcinoma, grade 2, estrogen receptor positive at 85%, progesterone receptor positive at 65%, both with strong staining intensity, and HER-2 not amplified by immunohistochemistry (1+).   On 01/19/2017 she underwent bilateral breast MRIs which showed in addition to the known right breast mass an additional area of concern in the central right breast. This measured 0.6 cm. MRI guided biopsy 02/06/2017 confirmed that this second area also was invasive lobular carcinoma, grade 2. It was located (by MRI) 1.5 cm inferior medial to the previously biopsied mass. Also there was a 0.6 mm sternal lesion in the inferior aspect of the sternum.  Because of the sternal lesion on the patient underwent PET scanning 02/12/2017.  This was entirely negative and the sternal lesion is felt to be most consistent with a bone island  On 02/20/2017 the patient underwent right lumpectomy. This showed ([id number]) invasive lobular carcinoma measuring 0.5 cm,  grade 2, with evidence of lobular carcinoma in situ. Margins were clear. Both sentinel lymph nodes were clear. [NB: the second biopsied lesion was present in the surgical sample but not addressed in the report; I have discussed this with pathology and Dr Catha Brow has kindly agreed to review the case, measure this lesion and margins, and dictate an addendum]  The patient's subsequent history is as detailed below.   PAST MEDICAL HISTORY: Past Medical History:  Diagnosis Date  . Adenomatous colon polyp 04/18/2011  . Allergy   . Arthritis   . Cancer ([place]) 1997   squamous and basal cell  . Diabetes mellitus    type II  . Hyperlipidemia   . Hypertension   . Osteopenia   . Sleep apnea    CPAP    PAST SURGICAL HISTORY: Past Surgical History:  Procedure Laterality Date  . arthroscopic knee  01/2008   right knee  . CATARACT EXTRACTION Left 2016  . CHOLECYSTECTOMY  1981  . COLONOSCOPY    . FOOT SURGERY  07/2007   right foot ([place])  . KNEE ARTHROSCOPY     left  . TONSILLECTOMY  1953  . torn maniscus  11/2010   left knee  . VAGINAL HYSTERECTOMY  1994    FAMILY HISTORY Family History  Problem Relation Age of Onset  . Lung cancer Father 57  . Colon cancer Neg Hx   The patient's father died from lung cancer in the setting of tobacco abuse. He was 73 years old. The patient's mother died at 41 with Alzheimer's disease. The patient had no brothers, 2 sisters. One sister was diagnosed with breast cancer at age 81. There is also a maternal cousin with breast cancer. There is no history of ovarian or prostate cancer in the family. The patient was genetically tested at [facility] and reportedly carries a heterozygous MutYH mutation. I do have requested that report   GYNECOLOGIC HISTORY:  No LMP recorded. Patient has had a hysterectomy. Menarche age 39, first live birth age 29, the patient is [place] P2. She underwent total abdominal hysterectomy with bilateral salpingo-oophorectomy approximately  age 31. She took hormone replacement for approximately 17 years.   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 [name] is a retired Scientist, water quality. Daughter [name] is a Risk manager in [place]. Daughter [name] is Web designer at the country day school locally. The patient has 3 grandchildren. She is a Tourist information centre manager.    ADVANCED DIRECTIVES:    HEALTH MAINTENANCE: Social History   Tobacco Use  . Smoking status: Former Smoker    Quit date: 07/21/1968    Years since quitting: 50.9  . Smokeless tobacco: Never Used  Substance Use Topics  . Alcohol use: Yes    Comment: rarely wine  . Drug use: No     Colonoscopy: May 2018; Nandigam  PAP:  Bone density:   Allergies  Allergen Reactions  . Ciprofloxacin Swelling and Rash    other  . Flagyl [Metronidazole] Swelling  . Celecoxib Swelling  . Diazepam Other (See Comments)    dpression  . Hydrocodone Other (See Comments)    Could not sleep  . Oxycodone Other (See Comments)    Restless  . Sulfa Antibiotics Rash  . Sulfonamide Derivatives Rash  Current Outpatient Medications  Medication Sig Dispense Refill  . cholecalciferol (VITAMIN D) 1000 UNITS tablet Take 1,000 Units by mouth daily.      . diflunisal (DOLOBID) 500 MG TABS tablet Take 1 tablet (500 mg total) by mouth 2 (two) times daily. 60 tablet   . exemestane (AROMASIN) 25 MG tablet TAKE 1 TABLET(25 MG) BY MOUTH DAILY AFTER BREAKFAST 90 tablet 0  . glucose blood test strip 1 each by Other route as needed for other. Use as instructed    . lisinopril (PRINIVIL,ZESTRIL) 10 MG tablet Take 10 mg by mouth daily.      . metFORMIN (GLUCOPHAGE) 500 MG tablet Take 500 mg by mouth daily after supper.      . ONE TOUCH LANCETS MISC by Does not apply route.    . pravastatin (PRAVACHOL) 10 MG tablet Take 10 mg by mouth daily.      . Probiotic Product (ALIGN PO) Take 1 capsule by mouth daily.    . vitamin B-12 (CYANOCOBALAMIN) 1000 MCG tablet Take  1,000 mcg by mouth daily.     Current Facility-Administered Medications  Medication Dose Route Frequency Provider Last Rate Last Dose  . 0.9 %  sodium chloride infusion  500 mL Intravenous Continuous Nandigam, Kavitha V, MD        OBJECTIVE: Middle-aged white woman in no acute distress  There were no vitals filed for this visit.   There is no height or weight on file to calculate BMI.   Wt Readings from Last 3 Encounters:  03/04/18 209 lb 11.2 oz (95.1 kg)  09/28/17 207 lb (93.9 kg)  07/27/17 207 lb (93.9 kg)      ECOG FS:1 - Symptomatic but completely ambulatory  Televisit  LAB RESULTS:  CMP     Component Value Date/Time   NA 140 03/01/2018 1026   NA 139 03/17/2017 1524   K 4.8 03/01/2018 1026   K 4.2 03/17/2017 1524   CL 108 03/01/2018 1026   CO2 23 03/01/2018 1026   CO2 23 03/17/2017 1524   GLUCOSE 101 (H) 03/01/2018 1026   GLUCOSE 102 03/17/2017 1524   BUN 27 (H) 03/01/2018 1026   BUN 28.6 (H) 03/17/2017 1524   CREATININE 1.01 (H) 03/01/2018 1026   CREATININE 0.9 03/17/2017 1524   CALCIUM 9.7 03/01/2018 1026   CALCIUM 9.8 03/17/2017 1524   PROT 7.0 03/01/2018 1026   PROT 6.8 03/17/2017 1524   ALBUMIN 3.6 03/01/2018 1026   ALBUMIN 3.8 03/17/2017 1524   AST 16 03/01/2018 1026   AST 15 03/17/2017 1524   ALT 17 03/01/2018 1026   ALT 16 03/17/2017 1524   ALKPHOS 90 03/01/2018 1026   ALKPHOS 88 03/17/2017 1524   BILITOT 0.4 03/01/2018 1026   BILITOT 0.31 03/17/2017 1524   GFRNONAA 54 (L) 03/01/2018 1026   GFRAA >60 03/01/2018 1026    No results found for: TOTALPROTELP, ALBUMINELP, A1GS, A2GS, BETS, BETA2SER, GAMS, MSPIKE, SPEI  No results found for: KPAFRELGTCHN, LAMBDASER, [facility]  Lab Results  Component Value Date   WBC 7.9 03/01/2018   NEUTROABS 4.5 03/01/2018   HGB 12.2 03/01/2018   HCT 37.8 03/01/2018   MCV 94.7 03/01/2018   PLT 176 03/01/2018      Chemistry      Component Value Date/Time   NA 140 03/01/2018 1026   NA 139 03/17/2017  1524   K 4.8 03/01/2018 1026   K 4.2 03/17/2017 1524   CL 108 03/01/2018 1026   CO2 23 03/01/2018 1026  CO2 23 03/17/2017 1524   BUN 27 (H) 03/01/2018 1026   BUN 28.6 (H) 03/17/2017 1524   CREATININE 1.01 (H) 03/01/2018 1026   CREATININE 0.9 03/17/2017 1524      Component Value Date/Time   CALCIUM 9.7 03/01/2018 1026   CALCIUM 9.8 03/17/2017 1524   ALKPHOS 90 03/01/2018 1026   ALKPHOS 88 03/17/2017 1524   AST 16 03/01/2018 1026   AST 15 03/17/2017 1524   ALT 17 03/01/2018 1026   ALT 16 03/17/2017 1524   BILITOT 0.4 03/01/2018 1026   BILITOT 0.31 03/17/2017 1524       No results found for: LABCA2  No components found for: VFIEPP295  No results for input(s): INR in the last 168 hours.  No results found for: LABCA2  No results found for: JOA416  No results found for: SAY301  No results found for: SWF093  No results found for: CA2729  No components found for: HGQUANT  No results found for: CEA1 / No results found for: CEA1   No results found for: AFPTUMOR  No results found for: CHROMOGRNA  No results found for: PSA1  No visits with results within 3 Day(s) from this visit.  Latest known visit with results is:  Appointment on 03/01/2018  Component Date Value Ref Range Status  . Vit D, 25-Hydroxy 03/01/2018 34.1  30.0 - 100.0 ng/mL Final   Comment: (NOTE) Vitamin D deficiency has been defined by the [place] practice guideline as a level of serum 25-OH vitamin D less than 20 ng/mL (1,2). The Endocrine Society went on to further define vitamin D insufficiency as a level between 21 and 29 ng/mL (2). 1. IOM (Institute of Medicine). 2010. Dietary reference   intakes for calcium and D. [place]: The   [employer]. 2. Holick MF, Binkley Coleman, Bischoff-Ferrari HA, et al.   Evaluation, treatment, and prevention of vitamin D   deficiency: an Endocrine Society clinical practice   guideline. JCEM. 2011 Jul;  96(7):1911-30. Performed At: [facility] [place], [place] [id number] Rush Farmer MD UR:[id number]   . Sodium 03/01/2018 140  135 - 145 mmol/L Final  . Potassium 03/01/2018 4.8  3.5 - 5.1 mmol/L Final  . Chloride 03/01/2018 108  98 - 111 mmol/L Final  . CO2 03/01/2018 23  22 - 32 mmol/L Final  . Glucose, Bld 03/01/2018 101* 70 - 99 mg/dL Final  . BUN 03/01/2018 27* 8 - 23 mg/dL Final  . Creatinine, Ser 03/01/2018 1.01* 0.44 - 1.00 mg/dL Final  . Calcium 03/01/2018 9.7  8.9 - 10.3 mg/dL Final  . Total Protein 03/01/2018 7.0  6.5 - 8.1 g/dL Final  . Albumin 03/01/2018 3.6  3.5 - 5.0 g/dL Final  . AST 03/01/2018 16  15 - 41 U/L Final  . ALT 03/01/2018 17  0 - 44 U/L Final  . Alkaline Phosphatase 03/01/2018 90  38 - 126 U/L Final  . Total Bilirubin 03/01/2018 0.4  0.3 - 1.2 mg/dL Final  . GFR calc non Af Amer 03/01/2018 54* >60 mL/min Final  . GFR calc Af Amer 03/01/2018 >60  >60 mL/min Final   Comment: (NOTE) The eGFR has been calculated using the CKD EPI equation. This calculation has not been validated in all clinical situations. eGFR's persistently <60 mL/min signify possible Chronic Kidney Disease.   Georgiann Hahn gap 03/01/2018 9  5 - 15 Final   Performed at [facility], [place]  [address] [place], [place] [address]  . WBC 03/01/2018 7.9  3.9 - 10.3 K/uL Final  . RBC 03/01/2018 3.99  3.70 - 5.45 MIL/uL Final  . Hemoglobin 03/01/2018 12.2  11.6 - 15.9 g/dL Final  . HCT 03/01/2018 37.8  34.8 - 46.6 % Final  . MCV 03/01/2018 94.7  79.5 - 101.0 fL Final  . MCH 03/01/2018 30.6  25.1 - 34.0 pg Final  . MCHC 03/01/2018 32.3  31.5 - 36.0 g/dL Final  . RDW 03/01/2018 14.5  11.2 - 14.5 % Final  . Platelets 03/01/2018 176  145 - 400 K/uL Final  . Neutrophils Relative % 03/01/2018 57  % Final  . Neutro Abs 03/01/2018 4.5  1.5 - 6.5 K/uL Final  . Lymphocytes Relative 03/01/2018 33  % Final  . Lymphs Abs 03/01/2018 2.6  0.9 - 3.3 K/uL Final  .  Monocytes Relative 03/01/2018 8  % Final  . Monocytes Absolute 03/01/2018 0.6  0.1 - 0.9 K/uL Final  . Eosinophils Relative 03/01/2018 2  % Final  . Eosinophils Absolute 03/01/2018 0.2  0.0 - 0.5 K/uL Final  . Basophils Relative 03/01/2018 0  % Final  . Basophils Absolute 03/01/2018 0.0  0.0 - 0.1 K/uL Final   Performed at [facility], [place] [address] [place], [place] [address]    (this displays the last labs from the last 3 days)  No results found for: TOTALPROTELP, ALBUMINELP, A1GS, A2GS, BETS, BETA2SER, GAMS, MSPIKE, SPEI (this displays SPEP labs)  No results found for: KPAFRELGTCHN, LAMBDASER, KAPLAMBRATIO (kappa/lambda light chains)  No results found for: HGBA, HGBA2QUANT, HGBFQUANT, HGBSQUAN (Hemoglobinopathy evaluation)   No results found for: LDH  No results found for: IRON, TIBC, IRONPCTSAT (Iron and TIBC)  No results found for: FERRITIN  Urinalysis No results found for: COLORURINE, APPEARANCEUR, LABSPEC, PHURINE, GLUCOSEU, HGBUR, BILIRUBINUR, KETONESUR, PROTEINUR, UROBILINOGEN, NITRITE, LEUKOCYTESUR   STUDIES: No results found.   ELIGIBLE FOR AVAILABLE RESEARCH PROTOCOL: no  ASSESSMENT: 73 y.o. [place], [place] woman status post right lumpectomy 02/20/2017 for 2 separate areas of invasive lobular carcinoma, grade 2, only one addressed in the pathology report, that one being pT1a pN0, stage IA, estrogen and progesterone receptor strongly positive, HER-2 not amplified  (a) the second area of known invasive lobular carcinoma was also included in the surgical sample; its dimensions and margins were to be addressed in a pathology addendum  (1) Oncotype not indicated and no chemotherapy is warranted  (2) adjuvant radiation to the right breast completed 05/25/2017  (3) exemestane started 07/27/2017, switched to exemestane April 2019  (a) [facility] shows a T score of +0.4  (b) repeat bone density 04/26/2018 shows a T  score of -1.0  (4) per patient, she carries a heterozygous MutYH mutation  PLAN: Brena is now a little over 2 years out from definitive surgery for her breast cancer with no evidence of disease recurrence.  This is very favorable.  She is tolerating exemestane well.  The only concern is cost.  She did try other aromatase inhibitors and did not tolerate them well.  I have put a refill in for her and she will let me know if cost becomes such an issue that she would like to try something else  I suggested she try a wrist splint at night for her hand pain.  I also suggested that she walk on a regular basis despite the cold weather.  She will have her next mammogram August of next year.  I will see her again in November of next year.  She knows to call for any other problems that may develop before then.  Radames Mejorado, Virgie Dad, MD  06/07/19 11:00 AM Medical Oncology and Hematology [facility] [place], [place] [address] Tel. [phone]    Fax. [phone]   I, Wilburn Mylar, am acting as scribe for Dr. Virgie Dad. Allyson Tineo.  I, Lurline Del MD, have reviewed the above documentation for accuracy and completeness, and I agree with the above.

## 2019-06-07 ENCOUNTER — Inpatient Hospital Stay: Payer: PPO

## 2019-06-07 ENCOUNTER — Inpatient Hospital Stay: Payer: PPO | Attending: Oncology | Admitting: Oncology

## 2019-06-07 DIAGNOSIS — Z170 Estrogen receptor positive status [ER+]: Secondary | ICD-10-CM

## 2019-06-07 DIAGNOSIS — C50811 Malignant neoplasm of overlapping sites of right female breast: Secondary | ICD-10-CM | POA: Diagnosis not present

## 2019-06-07 MED ORDER — DIFLUNISAL 500 MG PO TABS: 500.0000 mg | ORAL_TABLET | Freq: Two times a day (BID) | ORAL | 4 refills | Status: DC

## 2019-06-08 ENCOUNTER — Telehealth: Payer: Self-pay | Admitting: Oncology

## 2019-06-08 NOTE — Telephone Encounter (Signed)
I left a message regarding schedule  

## 2019-07-25 ENCOUNTER — Other Ambulatory Visit: Payer: Self-pay | Admitting: Oncology

## 2019-09-27 DIAGNOSIS — G4733 Obstructive sleep apnea (adult) (pediatric): Secondary | ICD-10-CM | POA: Diagnosis not present

## 2019-11-02 ENCOUNTER — Other Ambulatory Visit: Payer: Self-pay | Admitting: Adult Health

## 2019-11-09 DIAGNOSIS — R109 Unspecified abdominal pain: Secondary | ICD-10-CM | POA: Diagnosis not present

## 2019-11-15 DIAGNOSIS — G4733 Obstructive sleep apnea (adult) (pediatric): Secondary | ICD-10-CM | POA: Diagnosis not present

## 2019-11-24 DIAGNOSIS — E119 Type 2 diabetes mellitus without complications: Secondary | ICD-10-CM | POA: Diagnosis not present

## 2019-11-24 DIAGNOSIS — I10 Essential (primary) hypertension: Secondary | ICD-10-CM | POA: Diagnosis not present

## 2019-11-25 DIAGNOSIS — Z7984 Long term (current) use of oral hypoglycemic drugs: Secondary | ICD-10-CM | POA: Diagnosis not present

## 2019-11-25 DIAGNOSIS — Z87891 Personal history of nicotine dependence: Secondary | ICD-10-CM | POA: Diagnosis not present

## 2019-11-25 DIAGNOSIS — I10 Essential (primary) hypertension: Secondary | ICD-10-CM | POA: Diagnosis not present

## 2019-11-25 DIAGNOSIS — E7849 Other hyperlipidemia: Secondary | ICD-10-CM | POA: Diagnosis not present

## 2019-11-25 DIAGNOSIS — C50411 Malignant neoplasm of upper-outer quadrant of right female breast: Secondary | ICD-10-CM | POA: Diagnosis not present

## 2019-11-25 DIAGNOSIS — E119 Type 2 diabetes mellitus without complications: Secondary | ICD-10-CM | POA: Diagnosis not present

## 2019-11-25 DIAGNOSIS — Z170 Estrogen receptor positive status [ER+]: Secondary | ICD-10-CM | POA: Diagnosis not present

## 2019-11-25 DIAGNOSIS — Z9989 Dependence on other enabling machines and devices: Secondary | ICD-10-CM | POA: Diagnosis not present

## 2019-11-25 DIAGNOSIS — G4733 Obstructive sleep apnea (adult) (pediatric): Secondary | ICD-10-CM | POA: Diagnosis not present

## 2020-01-10 ENCOUNTER — Telehealth: Payer: Self-pay | Admitting: Gastroenterology

## 2020-01-10 NOTE — Telephone Encounter (Signed)
Patient called states she has not been well since over the weekend. Has lower abd pain seeking advise

## 2020-01-11 ENCOUNTER — Other Ambulatory Visit: Payer: Self-pay

## 2020-01-11 DIAGNOSIS — K5790 Diverticulosis of intestine, part unspecified, without perforation or abscess without bleeding: Secondary | ICD-10-CM

## 2020-01-11 MED ORDER — AMOXICILLIN-POT CLAVULANATE 875-125 MG PO TABS: 1.0000 | ORAL_TABLET | Freq: Two times a day (BID) | ORAL | 0 refills | Status: AC

## 2020-01-11 NOTE — Telephone Encounter (Signed)
Patient has drug allergy to Cipro and Flagyl (swelling and rash). Please advise, thank you.

## 2020-01-11 NOTE — Telephone Encounter (Signed)
No penicillin allergy listed in patient's chart. Left detailed message on patient's cell phone

## 2020-01-11 NOTE — Telephone Encounter (Signed)
Please send Rx for Augmentin 875mg  BID X 5 days instead. Please check to make sure she doesn't have penicillin allergy. Thanks

## 2020-01-11 NOTE — Telephone Encounter (Signed)
Spoke with patient, patient w/ history of diverticulitis. Pt states that she is on a high protein diet, pt states that she has been constipated, bloating and gas, pain on left side right at her waist, pt states that she has a knot there. Pt states that she had a bowel movement Monday, light food over the weekend and a lot of water. Pt wanting to know if she can have a course of antibiotics for possible diverticulitis flare up. Please advise, thank you.  [facility] preferred [facility].

## 2020-01-11 NOTE — Telephone Encounter (Signed)
Please advise patient to continue with soft diet, increase fluid intake.  Send Rx for Cipro 500mg  BID and flagyl 500mg  TID X 7 days. Call with any worsening symptoms. Thanks

## 2020-02-10 DIAGNOSIS — G4733 Obstructive sleep apnea (adult) (pediatric): Secondary | ICD-10-CM | POA: Diagnosis not present

## 2020-02-10 DIAGNOSIS — Z9989 Dependence on other enabling machines and devices: Secondary | ICD-10-CM | POA: Diagnosis not present

## 2020-02-13 DIAGNOSIS — G4733 Obstructive sleep apnea (adult) (pediatric): Secondary | ICD-10-CM | POA: Diagnosis not present

## 2020-02-28 ENCOUNTER — Other Ambulatory Visit: Payer: Self-pay | Admitting: Adult Health

## 2020-04-02 DIAGNOSIS — D3132 Benign neoplasm of left choroid: Secondary | ICD-10-CM | POA: Diagnosis not present

## 2020-04-02 DIAGNOSIS — H52203 Unspecified astigmatism, bilateral: Secondary | ICD-10-CM | POA: Diagnosis not present

## 2020-04-02 DIAGNOSIS — H5213 Myopia, bilateral: Secondary | ICD-10-CM | POA: Diagnosis not present

## 2020-04-02 DIAGNOSIS — E119 Type 2 diabetes mellitus without complications: Secondary | ICD-10-CM | POA: Diagnosis not present

## 2020-04-02 DIAGNOSIS — H02834 Dermatochalasis of left upper eyelid: Secondary | ICD-10-CM | POA: Diagnosis not present

## 2020-04-02 DIAGNOSIS — H35411 Lattice degeneration of retina, right eye: Secondary | ICD-10-CM | POA: Diagnosis not present

## 2020-04-02 DIAGNOSIS — H02831 Dermatochalasis of right upper eyelid: Secondary | ICD-10-CM | POA: Diagnosis not present

## 2020-04-02 DIAGNOSIS — H26492 Other secondary cataract, left eye: Secondary | ICD-10-CM | POA: Diagnosis not present

## 2020-04-02 DIAGNOSIS — H43813 Vitreous degeneration, bilateral: Secondary | ICD-10-CM | POA: Diagnosis not present

## 2020-04-02 DIAGNOSIS — Z7984 Long term (current) use of oral hypoglycemic drugs: Secondary | ICD-10-CM | POA: Diagnosis not present

## 2020-04-02 DIAGNOSIS — H524 Presbyopia: Secondary | ICD-10-CM | POA: Diagnosis not present

## 2020-04-02 DIAGNOSIS — Z961 Presence of intraocular lens: Secondary | ICD-10-CM | POA: Diagnosis not present

## 2020-04-17 DIAGNOSIS — Z886 Allergy status to analgesic agent status: Secondary | ICD-10-CM | POA: Diagnosis not present

## 2020-04-17 DIAGNOSIS — Z888 Allergy status to other drugs, medicaments and biological substances status: Secondary | ICD-10-CM | POA: Diagnosis not present

## 2020-04-17 DIAGNOSIS — C50411 Malignant neoplasm of upper-outer quadrant of right female breast: Secondary | ICD-10-CM | POA: Diagnosis not present

## 2020-04-17 DIAGNOSIS — Z881 Allergy status to other antibiotic agents status: Secondary | ICD-10-CM | POA: Diagnosis not present

## 2020-04-17 DIAGNOSIS — Z885 Allergy status to narcotic agent status: Secondary | ICD-10-CM | POA: Diagnosis not present

## 2020-04-17 DIAGNOSIS — Z882 Allergy status to sulfonamides status: Secondary | ICD-10-CM | POA: Diagnosis not present

## 2020-04-17 DIAGNOSIS — Z79899 Other long term (current) drug therapy: Secondary | ICD-10-CM | POA: Diagnosis not present

## 2020-04-17 DIAGNOSIS — Z170 Estrogen receptor positive status [ER+]: Secondary | ICD-10-CM | POA: Diagnosis not present

## 2020-05-15 DIAGNOSIS — L821 Other seborrheic keratosis: Secondary | ICD-10-CM | POA: Diagnosis not present

## 2020-05-15 DIAGNOSIS — Z85828 Personal history of other malignant neoplasm of skin: Secondary | ICD-10-CM | POA: Diagnosis not present

## 2020-05-15 DIAGNOSIS — L988 Other specified disorders of the skin and subcutaneous tissue: Secondary | ICD-10-CM | POA: Diagnosis not present

## 2020-05-25 DIAGNOSIS — G4733 Obstructive sleep apnea (adult) (pediatric): Secondary | ICD-10-CM | POA: Diagnosis not present

## 2020-05-28 DIAGNOSIS — L659 Nonscarring hair loss, unspecified: Secondary | ICD-10-CM | POA: Diagnosis not present

## 2020-05-28 DIAGNOSIS — Z9989 Dependence on other enabling machines and devices: Secondary | ICD-10-CM | POA: Diagnosis not present

## 2020-05-28 DIAGNOSIS — G4733 Obstructive sleep apnea (adult) (pediatric): Secondary | ICD-10-CM | POA: Diagnosis not present

## 2020-05-28 DIAGNOSIS — Z170 Estrogen receptor positive status [ER+]: Secondary | ICD-10-CM | POA: Diagnosis not present

## 2020-05-28 DIAGNOSIS — E119 Type 2 diabetes mellitus without complications: Secondary | ICD-10-CM | POA: Diagnosis not present

## 2020-05-28 DIAGNOSIS — E7849 Other hyperlipidemia: Secondary | ICD-10-CM | POA: Diagnosis not present

## 2020-05-28 DIAGNOSIS — Z23 Encounter for immunization: Secondary | ICD-10-CM | POA: Diagnosis not present

## 2020-05-28 DIAGNOSIS — Z0000 Encounter for general adult medical examination without abnormal findings: Secondary | ICD-10-CM | POA: Diagnosis not present

## 2020-05-28 DIAGNOSIS — I10 Essential (primary) hypertension: Secondary | ICD-10-CM | POA: Diagnosis not present

## 2020-05-28 DIAGNOSIS — C50411 Malignant neoplasm of upper-outer quadrant of right female breast: Secondary | ICD-10-CM | POA: Diagnosis not present

## 2020-06-02 ENCOUNTER — Other Ambulatory Visit: Payer: Self-pay | Admitting: Adult Health

## 2020-06-07 ENCOUNTER — Inpatient Hospital Stay: Payer: PPO

## 2020-06-07 ENCOUNTER — Inpatient Hospital Stay: Payer: PPO | Attending: Oncology | Admitting: Oncology

## 2020-06-07 ENCOUNTER — Telehealth: Payer: Self-pay | Admitting: Oncology

## 2020-06-07 ENCOUNTER — Other Ambulatory Visit: Payer: Self-pay

## 2020-06-07 VITALS — BP 140/75 | HR 70 | Temp 97.8°F | Resp 18 | Ht 62.0 in | Wt 170.1 lb

## 2020-06-07 DIAGNOSIS — Z170 Estrogen receptor positive status [ER+]: Secondary | ICD-10-CM | POA: Insufficient documentation

## 2020-06-07 DIAGNOSIS — Z885 Allergy status to narcotic agent status: Secondary | ICD-10-CM | POA: Insufficient documentation

## 2020-06-07 DIAGNOSIS — Z803 Family history of malignant neoplasm of breast: Secondary | ICD-10-CM | POA: Insufficient documentation

## 2020-06-07 DIAGNOSIS — C50811 Malignant neoplasm of overlapping sites of right female breast: Secondary | ICD-10-CM | POA: Insufficient documentation

## 2020-06-07 DIAGNOSIS — Z79899 Other long term (current) drug therapy: Secondary | ICD-10-CM | POA: Diagnosis not present

## 2020-06-07 DIAGNOSIS — Z881 Allergy status to other antibiotic agents status: Secondary | ICD-10-CM | POA: Diagnosis not present

## 2020-06-07 DIAGNOSIS — Z801 Family history of malignant neoplasm of trachea, bronchus and lung: Secondary | ICD-10-CM | POA: Diagnosis not present

## 2020-06-07 DIAGNOSIS — Z87891 Personal history of nicotine dependence: Secondary | ICD-10-CM | POA: Diagnosis not present

## 2020-06-07 DIAGNOSIS — Z90722 Acquired absence of ovaries, bilateral: Secondary | ICD-10-CM | POA: Insufficient documentation

## 2020-06-07 DIAGNOSIS — Z79811 Long term (current) use of aromatase inhibitors: Secondary | ICD-10-CM | POA: Diagnosis not present

## 2020-06-07 DIAGNOSIS — Z882 Allergy status to sulfonamides status: Secondary | ICD-10-CM | POA: Diagnosis not present

## 2020-06-07 DIAGNOSIS — Z923 Personal history of irradiation: Secondary | ICD-10-CM | POA: Diagnosis not present

## 2020-06-07 DIAGNOSIS — Z9049 Acquired absence of other specified parts of digestive tract: Secondary | ICD-10-CM | POA: Insufficient documentation

## 2020-06-07 MED ORDER — EXEMESTANE 25 MG PO TABS
ORAL_TABLET | ORAL | 4 refills | Status: DC
Start: 2020-06-07 — End: 2020-11-26

## 2020-06-07 NOTE — Telephone Encounter (Signed)
Scheduled appts per 11/18 los. Gave pt a print out of AVS.  

## 2020-06-07 NOTE — Progress Notes (Signed)
[facility]  Telephone:([phone] Fax:([phone]) [phone]     ID: Diane Macias DOB: 25-Dec-1945  MR#: [id number]  NKN#:[id number]  Patient Care Team: Albertina Senegal, MD as PCP - General (Internal Medicine) Lovena Neighbours, MD as Referring Physician (Surgery) Perri Aragones, Valentino Hue, MD as Consulting Physician (Oncology) Lurline Hare, MD as Referring Physician (Radiation Oncology) Napoleon Form, MD as Consulting Physician (Gastroenterology) OTHER MD:   CHIEF COMPLAINT: Estrogen receptor positive lobular breast cancer   CURRENT TREATMENT: Exemestane   INTERVAL HISTORY: Diane Macias returns today for follow up of her estrogen receptor positive lobular breast cancer.   She continues on exemestane.  She tolerates this well except that her hair has been thinning.  She has no other side effects from this.  She is currently paying about $60 a month for this medication.  Her most recent bone density testing on 04/26/2018 at [facility] showed a T-score of -1.5, which is mildly osteopenic.  Since her last visit, she underwent bilateral diagnostic mammography with tomography at [facility] on 04/17/2020 showing: breast composition scattered fibroglandular density; no evidence of malignancy in either breast.    REVIEW OF SYSTEMS: Diane Macias joined [employer] and lost about 30 pounds.  However this made her baseline constipation worse.  She is currently off the program because of that.  She is not exercising on a regular basis and gets about 2000 steps a day maximum.  Aside from these issues a detailed review of systems today was stable   COVID 19 VACCINATION STATUS: Status post Pfizer x2+ booster September 2021   HISTORY OF CURRENT ILLNESS: From the original intake note:  Diane Macias had bilateral screening mammography at [facility] 11/30/2014 showing the breast density to be category B. There was a possible mass in the right breast. On 12/10/2016 she underwent right diagnostic  mammography with tomography and right breast ultrasonography. This confirmed a 0.4 cm spiculated mass in the periareolar area of the right breast. Ultrasound showed a small irregular hypoechoic mass at the 9:00 position 1 cm from the nipple, measuring 0.5 cm. Ultrasound of the right axilla was benign.  Biopsy of the right breast area in question 12/25/2016 showed an invasive lobular carcinoma, grade 2, estrogen receptor positive at 85%, progesterone receptor positive at 65%, both with strong staining intensity, and HER-2 not amplified by immunohistochemistry (1+).   On 01/19/2017 she underwent bilateral breast MRIs which showed in addition to the known right breast mass an additional area of concern in the central right breast. This measured 0.6 cm. MRI guided biopsy 02/06/2017 confirmed that this second area also was invasive lobular carcinoma, grade 2. It was located (by MRI) 1.5 cm inferior medial to the previously biopsied mass. Also there was a 0.6 mm sternal lesion in the inferior aspect of the sternum.  Because of the sternal lesion on the patient underwent PET scanning 02/12/2017. This was entirely negative and the sternal lesion is felt to be most consistent with a bone island  On 02/20/2017 the patient underwent right lumpectomy. This showed ([id number]) invasive lobular carcinoma measuring 0.5 cm, grade 2, with evidence of lobular carcinoma in situ. Margins were clear. Both sentinel lymph nodes were clear. [NB: the second biopsied lesion was present in the surgical sample but not addressed in the report; I have discussed this with pathology and Dr Hart Carwin has kindly agreed to review the case, measure this lesion and margins, and dictate an addendum]  The patient's subsequent history is as detailed below.   PAST MEDICAL  HISTORY: Past Medical History:  Diagnosis Date  . Adenomatous colon polyp 04/18/2011  . Allergy   . Arthritis   . Cancer ([place]) 1997   squamous and basal cell  . Diabetes  mellitus    type II  . Hyperlipidemia   . Hypertension   . Osteopenia   . Sleep apnea    CPAP    PAST SURGICAL HISTORY: Past Surgical History:  Procedure Laterality Date  . arthroscopic knee  01/2008   right knee  . CATARACT EXTRACTION Left 2016  . CHOLECYSTECTOMY  1981  . COLONOSCOPY    . FOOT SURGERY  07/2007   right foot (BUNION)  . KNEE ARTHROSCOPY     left  . TONSILLECTOMY  1953  . torn maniscus  11/2010   left knee  . VAGINAL HYSTERECTOMY  1994    FAMILY HISTORY Family History  Problem Relation Age of Onset  . Lung cancer Father 73  . Colon cancer Neg Hx   The patient's father died from lung cancer in the setting of tobacco abuse. He was 74 years old. The patient's mother died at 52 with Alzheimer's disease. The patient had no brothers, 2 sisters. One sister was diagnosed with breast cancer at age 64. There is also a maternal cousin with breast cancer. There is no history of ovarian or prostate cancer in the family. The patient was genetically tested at [facility] and reportedly carries a heterozygous MutYH mutation. I do have requested that report   GYNECOLOGIC HISTORY:  No LMP recorded. Patient has had a hysterectomy. Menarche age 47, first live birth age 28, the patient is [place] P2. She underwent total abdominal hysterectomy with bilateral salpingo-oophorectomy approximately age 17. She took hormone replacement for approximately 17 years.   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 Daughter Diane Macias is a Risk manager in [place]. Daughter Diane Macias is Web designer at the country day school locally. The patient has 3 grandchildren. She is a Tourist information centre manager.    ADVANCED DIRECTIVES:    HEALTH MAINTENANCE: Social History   Tobacco Use  . Smoking status: Former Smoker    Quit date: 07/21/1968    Years since quitting: 51.9  . Smokeless tobacco: Never Used  Substance Use Topics  . Alcohol use: Yes     Comment: rarely wine  . Drug use: No     Colonoscopy: May 2018; Diane Macias  PAP:  Bone density:   Allergies  Allergen Reactions  . Ciprofloxacin Swelling and Rash    other  . Flagyl [Metronidazole] Swelling  . Celecoxib Swelling  . Diazepam Other (See Comments)    dpression  . Hydrocodone Other (See Comments)    Could not sleep  . Oxycodone Other (See Comments)    Restless  . Sulfa Antibiotics Rash  . Sulfonamide Derivatives Rash    Current Outpatient Medications  Medication Sig Dispense Refill  . cholecalciferol (VITAMIN D) 1000 UNITS tablet Take 1,000 Units by mouth daily.      . diflunisal (DOLOBID) 500 MG TABS tablet Take 1 tablet (500 mg total) by mouth 2 (two) times daily. 90 tablet 4  . exemestane (AROMASIN) 25 MG tablet TAKE 1 TABLET(25 MG) BY MOUTH DAILY AFTER BREAKFAST 90 tablet 0  . glucose blood test strip 1 each by Other route as needed for other. Use as instructed    . lisinopril (PRINIVIL,ZESTRIL) 10 MG tablet Take 10 mg by mouth daily.      Marland Kitchen  metFORMIN (GLUCOPHAGE) 500 MG tablet Take 500 mg by mouth daily after supper.      . ONE TOUCH LANCETS MISC by Does not apply route.    . pravastatin (PRAVACHOL) 10 MG tablet Take 10 mg by mouth daily.      . Probiotic Product (ALIGN PO) Take 1 capsule by mouth daily.    . vitamin B-12 (CYANOCOBALAMIN) 1000 MCG tablet Take 1,000 mcg by mouth daily.     Current Facility-Administered Medications  Medication Dose Route Frequency Provider Last Rate Last Admin  . 0.9 %  sodium chloride infusion  500 mL Intravenous Continuous Diane Macias, Eleonore Chiquito, MD        OBJECTIVE: White woman in no acute distress  Vitals:   06/07/20 1105  BP: 140/75  Pulse: 70  Resp: 18  Temp: 97.8 F (36.6 C)  SpO2: 96%     Body mass index is 31.11 kg/m.   Wt Readings from Last 3 Encounters:  06/07/20 170 lb 1.6 oz (77.2 kg)  03/04/18 209 lb 11.2 oz (95.1 kg)  09/28/17 207 lb (93.9 kg)      ECOG FS:1 - Symptomatic but completely  ambulatory  Sclerae unicteric, EOMs intact Wearing a mask No cervical or supraclavicular adenopathy Lungs no rales or rhonchi Heart regular rate and rhythm Abd soft, nontender, positive bowel sounds MSK no focal spinal tenderness, no upper extremity lymphedema Neuro: nonfocal, well oriented, appropriate affect Breasts: The right breast is status post lumpectomy and radiation.  There is no evidence of local recurrence.  The left breast is benign.  Both axillae are benign.   LAB RESULTS:  Lab work obtained at [facility] 05/28/2020 showed an LDL of 55, hemoglobin A1c of 6.1, creatinine 0.86, normal liver function tests, white cell count 7.0 hemoglobin 13.2 and platelets 160,000.  CMP     Component Value Date/Time   NA 140 03/01/2018 1026   NA 139 03/17/2017 1524   K 4.8 03/01/2018 1026   K 4.2 03/17/2017 1524   CL 108 03/01/2018 1026   CO2 23 03/01/2018 1026   CO2 23 03/17/2017 1524   GLUCOSE 101 (H) 03/01/2018 1026   GLUCOSE 102 03/17/2017 1524   BUN 27 (H) 03/01/2018 1026   BUN 28.6 (H) 03/17/2017 1524   CREATININE 1.01 (H) 03/01/2018 1026   CREATININE 0.9 03/17/2017 1524   CALCIUM 9.7 03/01/2018 1026   CALCIUM 9.8 03/17/2017 1524   PROT 7.0 03/01/2018 1026   PROT 6.8 03/17/2017 1524   ALBUMIN 3.6 03/01/2018 1026   ALBUMIN 3.8 03/17/2017 1524   AST 16 03/01/2018 1026   AST 15 03/17/2017 1524   ALT 17 03/01/2018 1026   ALT 16 03/17/2017 1524   ALKPHOS 90 03/01/2018 1026   ALKPHOS 88 03/17/2017 1524   BILITOT 0.4 03/01/2018 1026   BILITOT 0.31 03/17/2017 1524   GFRNONAA 54 (L) 03/01/2018 1026   GFRAA >60 03/01/2018 1026    Lab Results  Component Value Date   WBC 7.9 03/01/2018   NEUTROABS 4.5 03/01/2018   HGB 12.2 03/01/2018   HCT 37.8 03/01/2018   MCV 94.7 03/01/2018   PLT 176 03/01/2018      Chemistry      Component Value Date/Time   NA 140 03/01/2018 1026   NA 139 03/17/2017 1524   K 4.8 03/01/2018 1026   K 4.2 03/17/2017 1524   CL 108 03/01/2018  1026   CO2 23 03/01/2018 1026   CO2 23 03/17/2017 1524   BUN 27 (H) 03/01/2018 1026  BUN 28.6 (H) 03/17/2017 1524   CREATININE 1.01 (H) 03/01/2018 1026   CREATININE 0.9 03/17/2017 1524      Component Value Date/Time   CALCIUM 9.7 03/01/2018 1026   CALCIUM 9.8 03/17/2017 1524   ALKPHOS 90 03/01/2018 1026   ALKPHOS 88 03/17/2017 1524   AST 16 03/01/2018 1026   AST 15 03/17/2017 1524   ALT 17 03/01/2018 1026   ALT 16 03/17/2017 1524   BILITOT 0.4 03/01/2018 1026   BILITOT 0.31 03/17/2017 1524       No results found for: LABCA2  No components found for: [id number]  No results for input(s): INR in the last 168 hours.  No results found for: LABCA2  No results found for: KKX381  No results found for: WEX937  No results found for: JIR678  No results found for: CA2729  No components found for: HGQUANT  No results found for: CEA1 / No results found for: CEA1   No results found for: AFPTUMOR  No results found for: CHROMOGRNA  No results found for: TOTALPROTELP, ALBUMINELP, A1GS, A2GS, BETS, BETA2SER, GAMS, MSPIKE, SPEI (this displays SPEP labs)  No results found for: KPAFRELGTCHN, LAMBDASER, KAPLAMBRATIO (kappa/lambda light chains)  No results found for: HGBA, HGBA2QUANT, HGBFQUANT, HGBSQUAN (Hemoglobinopathy evaluation)   No results found for: LDH  No results found for: IRON, TIBC, IRONPCTSAT (Iron and TIBC)  No results found for: FERRITIN  Urinalysis No results found for: COLORURINE, APPEARANCEUR, LABSPEC, PHURINE, GLUCOSEU, HGBUR, BILIRUBINUR, KETONESUR, PROTEINUR, UROBILINOGEN, NITRITE, LEUKOCYTESUR   STUDIES: No results found.   ELIGIBLE FOR AVAILABLE RESEARCH PROTOCOL: no  ASSESSMENT: 74 y.o. [place], [place] woman status post right lumpectomy 02/20/2017 for 2 separate areas of invasive lobular carcinoma, grade 2, only one addressed in the pathology report, that one being pT1a pN0, stage IA, estrogen and progesterone receptor strongly positive, HER-2  not amplified  (a) the second area of known invasive lobular carcinoma was also included in the surgical sample; its dimensions and margins were to be addressed in a pathology addendum  (1) Oncotype not indicated and no chemotherapy is warranted  (2) adjuvant radiation to the right breast completed 05/25/2017  (3) exemestane started 07/27/2017, switched to exemestane April 2019  (a) bone density at [facility] 11/27/2015 shows a T score of +0.4  (b) repeat bone density 04/26/2018 shows a T score of -1.0   (4) per patient, she carries a heterozygous MutYH mutation   PLAN: Diane Macias is now a little over 3 years out from definitive surgery for her breast cancer with no evidence of disease recurrence.  This is very favorable.  She is tolerating exemestane well.  The hair thinning is very likely related to the exemestane.  I am comfortable with her being off the medication a couple of months.  If she stops now she can pick it up again mid January.  We did discuss other possibilities including Rogaine and biotin.  If she needs to work on constipation I suggested MiraLAX daily with stool softeners 2 tablets twice daily as needed  I commended her excellent weight loss program and encouraged her to exercise more  Total encounter time 25 minutes.*   Dan Scearce, Virgie Dad, MD  06/07/20 11:15 AM Medical Oncology and Hematology [facility] [place], [place] [address] Tel. [phone]    Fax. [phone]   I, Wilburn Mylar, am acting as scribe for Dr. Virgie Dad. Keziah Drotar.  Lindie Spruce MD, have reviewed the above documentation for accuracy and completeness, and  I agree with the above.   *Total Encounter Time as defined by the Centers for Medicare and Medicaid Services includes, in addition to the face-to-face time of a patient visit (documented in the note above) non-face-to-face time: obtaining and reviewing outside history, ordering and reviewing medications,  tests or procedures, care coordination (communications with other health care professionals or caregivers) and documentation in the medical record.

## 2020-09-07 DIAGNOSIS — R197 Diarrhea, unspecified: Secondary | ICD-10-CM | POA: Diagnosis not present

## 2020-09-07 DIAGNOSIS — R6889 Other general symptoms and signs: Secondary | ICD-10-CM | POA: Diagnosis not present

## 2020-09-07 DIAGNOSIS — Z20822 Contact with and (suspected) exposure to covid-19: Secondary | ICD-10-CM | POA: Diagnosis not present

## 2020-09-10 DIAGNOSIS — G4733 Obstructive sleep apnea (adult) (pediatric): Secondary | ICD-10-CM | POA: Diagnosis not present

## 2020-11-25 ENCOUNTER — Other Ambulatory Visit: Payer: Self-pay | Admitting: Adult Health

## 2020-11-27 DIAGNOSIS — Z9989 Dependence on other enabling machines and devices: Secondary | ICD-10-CM | POA: Diagnosis not present

## 2020-11-27 DIAGNOSIS — I10 Essential (primary) hypertension: Secondary | ICD-10-CM | POA: Diagnosis not present

## 2020-11-27 DIAGNOSIS — G4733 Obstructive sleep apnea (adult) (pediatric): Secondary | ICD-10-CM | POA: Diagnosis not present

## 2020-11-27 DIAGNOSIS — Z853 Personal history of malignant neoplasm of breast: Secondary | ICD-10-CM | POA: Diagnosis not present

## 2020-11-27 DIAGNOSIS — E782 Mixed hyperlipidemia: Secondary | ICD-10-CM | POA: Diagnosis not present

## 2020-11-27 DIAGNOSIS — E119 Type 2 diabetes mellitus without complications: Secondary | ICD-10-CM | POA: Diagnosis not present

## 2020-11-30 ENCOUNTER — Other Ambulatory Visit: Payer: Self-pay | Admitting: *Deleted

## 2020-11-30 MED ORDER — EXEMESTANE 25 MG PO TABS: ORAL_TABLET | ORAL | 4 refills | Status: DC

## 2020-12-26 DIAGNOSIS — G4733 Obstructive sleep apnea (adult) (pediatric): Secondary | ICD-10-CM | POA: Diagnosis not present

## 2020-12-29 ENCOUNTER — Emergency Department (HOSPITAL_BASED_OUTPATIENT_CLINIC_OR_DEPARTMENT_OTHER): Payer: PPO

## 2020-12-29 ENCOUNTER — Emergency Department (HOSPITAL_BASED_OUTPATIENT_CLINIC_OR_DEPARTMENT_OTHER)
Admission: EM | Admit: 2020-12-29 | Discharge: 2020-12-29 | Disposition: A | Discharge status: Home / Self Care | Payer: PPO | Attending: Emergency Medicine | Admitting: Emergency Medicine

## 2020-12-29 ENCOUNTER — Other Ambulatory Visit: Payer: Self-pay

## 2020-12-29 ENCOUNTER — Encounter (HOSPITAL_BASED_OUTPATIENT_CLINIC_OR_DEPARTMENT_OTHER): Payer: Self-pay | Admitting: Emergency Medicine

## 2020-12-29 DIAGNOSIS — S50312A Abrasion of left elbow, initial encounter: Secondary | ICD-10-CM | POA: Insufficient documentation

## 2020-12-29 DIAGNOSIS — I10 Essential (primary) hypertension: Secondary | ICD-10-CM | POA: Insufficient documentation

## 2020-12-29 DIAGNOSIS — Z79899 Other long term (current) drug therapy: Secondary | ICD-10-CM | POA: Diagnosis not present

## 2020-12-29 DIAGNOSIS — S0990XA Unspecified injury of head, initial encounter: Secondary | ICD-10-CM | POA: Insufficient documentation

## 2020-12-29 DIAGNOSIS — S0991XA Unspecified injury of ear, initial encounter: Secondary | ICD-10-CM | POA: Diagnosis present

## 2020-12-29 DIAGNOSIS — W19XXXA Unspecified fall, initial encounter: Secondary | ICD-10-CM

## 2020-12-29 DIAGNOSIS — Z87891 Personal history of nicotine dependence: Secondary | ICD-10-CM | POA: Insufficient documentation

## 2020-12-29 DIAGNOSIS — S51811A Laceration without foreign body of right forearm, initial encounter: Secondary | ICD-10-CM | POA: Diagnosis not present

## 2020-12-29 DIAGNOSIS — W01198A Fall on same level from slipping, tripping and stumbling with subsequent striking against other object, initial encounter: Secondary | ICD-10-CM | POA: Insufficient documentation

## 2020-12-29 DIAGNOSIS — S5011XA Contusion of right forearm, initial encounter: Secondary | ICD-10-CM | POA: Diagnosis not present

## 2020-12-29 DIAGNOSIS — T07XXXA Unspecified multiple injuries, initial encounter: Secondary | ICD-10-CM

## 2020-12-29 DIAGNOSIS — E119 Type 2 diabetes mellitus without complications: Secondary | ICD-10-CM | POA: Diagnosis not present

## 2020-12-29 DIAGNOSIS — M50323 Other cervical disc degeneration at C6-C7 level: Secondary | ICD-10-CM | POA: Diagnosis not present

## 2020-12-29 DIAGNOSIS — Z7984 Long term (current) use of oral hypoglycemic drugs: Secondary | ICD-10-CM | POA: Diagnosis not present

## 2020-12-29 DIAGNOSIS — Z23 Encounter for immunization: Secondary | ICD-10-CM | POA: Insufficient documentation

## 2020-12-29 DIAGNOSIS — Y9389 Activity, other specified: Secondary | ICD-10-CM | POA: Insufficient documentation

## 2020-12-29 DIAGNOSIS — S01312A Laceration without foreign body of left ear, initial encounter: Secondary | ICD-10-CM | POA: Diagnosis not present

## 2020-12-29 DIAGNOSIS — Z85828 Personal history of other malignant neoplasm of skin: Secondary | ICD-10-CM | POA: Insufficient documentation

## 2020-12-29 DIAGNOSIS — Z853 Personal history of malignant neoplasm of breast: Secondary | ICD-10-CM | POA: Insufficient documentation

## 2020-12-29 DIAGNOSIS — Z043 Encounter for examination and observation following other accident: Secondary | ICD-10-CM | POA: Diagnosis not present

## 2020-12-29 DIAGNOSIS — M1712 Unilateral primary osteoarthritis, left knee: Secondary | ICD-10-CM | POA: Diagnosis not present

## 2020-12-29 DIAGNOSIS — Y92009 Unspecified place in unspecified non-institutional (private) residence as the place of occurrence of the external cause: Secondary | ICD-10-CM | POA: Diagnosis not present

## 2020-12-29 MED ORDER — TETANUS-DIPHTH-ACELL PERTUSSIS 5-2.5-18.5 LF-MCG/0.5 IM SUSY
0.5000 mL | PREFILLED_SYRINGE | Freq: Once | INTRAMUSCULAR | Status: AC
  Administered 2020-12-29: 0.5 mL via INTRAMUSCULAR
  Filled 2020-12-29: qty 0.5

## 2020-12-29 MED ORDER — ACETAMINOPHEN 500 MG PO TABS
1000.0000 mg | ORAL_TABLET | Freq: Once | ORAL | Status: AC
  Administered 2020-12-29: 1000 mg via ORAL
  Filled 2020-12-29: qty 2

## 2020-12-29 MED ORDER — TRAMADOL HCL 50 MG PO TABS: ORAL_TABLET | ORAL | 0 refills | Status: DC

## 2020-12-29 MED ORDER — TRAMADOL HCL 50 MG PO TABS
50.0000 mg | ORAL_TABLET | Freq: Once | ORAL | Status: AC
Start: 2020-12-29 — End: 2020-12-29
  Administered 2020-12-29: 50 mg via ORAL
  Filled 2020-12-29: qty 1

## 2020-12-29 MED ORDER — LIDOCAINE HCL (PF) 1 % IJ SOLN
5.0000 mL | Freq: Once | INTRAMUSCULAR | Status: AC
  Administered 2020-12-29: 5 mL
  Filled 2020-12-29: qty 5

## 2020-12-29 NOTE — Discharge Instructions (Addendum)
1.  Your stitches in your earlobe are absorbable.  You may wash as usual, dry and apply antibiotic ointment. 2.  You have a skin tear on your arm.  Leave the yellow Xeroform dressing in place for 3 to 4 days.  You may then remove it and apply a nonstick dressing and keep the wound clean with a nonstick dressing and wrap over it. 3.  You have many areas of bruises.  Apply well wrapped ice packs to sore or swollen areas.  You may take extra strength Tylenol every 6 hours and you also may take tramadol 1 to 2 tablets with this for pain control. 4.  Schedule a recheck with your doctor to monitor your wounds.

## 2020-12-29 NOTE — ED Provider Notes (Signed)
[facility] Provider Note   CSN: [id number] Arrival date & time: 12/29/20  1846     History Chief Complaint  Patient presents with   Fall   Extremity Laceration    Diane Macias is a 75 y.o. female.  HPI Patient reports that she was on her patio trying to get her cat in the house when she lost her balance and fell.  She reports she crashed into a planter on the patio.  She hit the left side of her head.  She did not have loss of consciousness.  At the time there was some bruising of the temple but that seems of improved.  She reports she is having bleeding and seems to have a chunk out of her left earlobe.  She reports she is also having pain in her left knee.  She reports when she pushes on it she feels like there is something moving.  She has been able to walk on the leg.  She also points out a abrasion and superficial laceration on the right forearm.  Normal movement of the right arm.  Patient is not on an anticoagulant    Past Medical History:  Diagnosis Date   Adenomatous colon polyp 04/18/2011   Allergy    Arthritis    Cancer ([place]) 1997   squamous and basal cell   Diabetes mellitus    type II   Hyperlipidemia    Hypertension    Osteopenia    Sleep apnea    CPAP    Patient Active Problem List   Diagnosis Date Noted   Malignant neoplasm of overlapping sites of right breast in female, estrogen receptor positive ([place]) 03/17/2017   Diabetes mellitus type II, controlled ([place]) 11/05/2016   Diverticulosis 11/05/2016   Essential hypertension 11/05/2016   Fatty liver 11/05/2016   Hyperlipemia 11/05/2016   Obesity (BMI 30-39.9) 11/05/2016   OSA on CPAP 11/05/2016   Initial Medicare annual wellness visit 03/26/2016   Referred otalgia of right ear 09/03/2015   Sensorineural hearing loss 09/03/2015   Incomplete tear of right rotator cuff 12/09/2014   Impingement syndrome of right shoulder 11/30/2014   Tendinitis of right rotator cuff  11/30/2014   Lower urinary tract infectious disease 03/09/2014   Mixed incontinence 03/09/2014   CARPAL TUNNEL SYNDROME 01/02/2009   INTERNAL HEMORRHOIDS 01/02/2009   DIVERTICULOSIS, COLON 01/02/2009   FATTY LIVER DISEASE 01/02/2009   ENDOMETRIOSIS 01/02/2009   OSTEOPOROSIS 01/02/2009   SKIN CANCER, HX OF 01/02/2009   COLONIC POLYPS, ADENOMATOUS, HX OF 01/02/2009   DIVERTICULITIS, HX OF 01/02/2009    Past Surgical History:  Procedure Laterality Date   arthroscopic knee  01/2008   right knee   CATARACT EXTRACTION Left 2016   CHOLECYSTECTOMY  1981   COLONOSCOPY     FOOT SURGERY  07/2007   right foot (BUNION)   KNEE ARTHROSCOPY     left   TONSILLECTOMY  1953   torn maniscus  11/2010   left knee   VAGINAL HYSTERECTOMY  1994     OB History   No obstetric history on file.     Family History  Problem Relation Age of Onset   Lung cancer Father 89   Colon cancer Neg Hx     Social History   Tobacco Use   Smoking status: Former    Pack years: 0.00    Types: Cigarettes    Quit date: 07/21/1968    Years since quitting: 52.4   Smokeless tobacco: Never  Substance Use Topics   Alcohol use: Yes    Comment: rarely wine   Drug use: No    Home Medications Prior to Admission medications   Medication Sig Start Date End Date Taking? Authorizing Provider  traMADol (ULTRAM) 50 MG tablet 1-2 tablets every 6 hours as needed for pain 12/29/20  Yes Major Santerre, Jeannie Done, MD  cholecalciferol (VITAMIN D) 1000 UNITS tablet Take 1,000 Units by mouth daily.      [provider]  exemestane (AROMASIN) 25 MG tablet TAKE 1 TABLET(25 MG) BY MOUTH DAILY AFTER AND BREAKFAST 11/30/20   Magrinat, Virgie Dad, MD  glucose blood test strip 1 each by Other route as needed for other. Use as instructed    [provider]  lisinopril (PRINIVIL,ZESTRIL) 10 MG tablet Take 10 mg by mouth daily.      [provider]  metFORMIN (GLUCOPHAGE) 500 MG tablet Take 500 mg by mouth daily after  supper.      [provider]  ONE TOUCH LANCETS MISC by Does not apply route.    [provider]  Probiotic Product (ALIGN PO) Take 1 capsule by mouth daily.    [provider]  rosuvastatin (CRESTOR) 5 MG tablet Take 1 tablet (5 mg total) by mouth daily. 06/07/20   Magrinat, Virgie Dad, MD  vitamin B-12 (CYANOCOBALAMIN) 1000 MCG tablet Take 1,000 mcg by mouth daily.    [provider]    Allergies    Ciprofloxacin, Flagyl [metronidazole], Celecoxib, Diazepam, Hydrocodone, Oxycodone, Sulfa antibiotics, and Sulfonamide derivatives  Review of Systems   Review of Systems 10 systems reviewed and negative except as per HPI Physical Exam Updated Vital Signs BP (!) 149/66 (BP Location: Left Arm)   Pulse 88   Temp 98.8 F (37.1 C) (Oral)   Resp 18   Ht 5\' 2"  (1.575 m)   Wt 78.9 kg   SpO2 99%   BMI 31.83 kg/m   Physical Exam Constitutional:      Appearance: Normal appearance.  HENT:     Head:     Comments: No facial contusions or abrasions.    Ears:     Comments: Laceration to the back of the earlobe with some gaping.    Nose: Nose normal.     Mouth/Throat:     Pharynx: Oropharynx is clear.  Eyes:     Extraocular Movements: Extraocular movements intact.     Pupils: Pupils are equal, round, and reactive to light.  Neck:     Comments: Patient endorses some C-spine tenderness in the lower neck. Cardiovascular:     Rate and Rhythm: Normal rate and regular rhythm.  Pulmonary:     Effort: Pulmonary effort is normal.     Breath sounds: Normal breath sounds.  Chest:     Chest wall: No tenderness.  Abdominal:     General: There is no distension.     Palpations: Abdomen is soft.     Tenderness: There is no abdominal tenderness. There is no guarding.  Musculoskeletal:     Comments: Abrasion to the right forearm with petechial abrasion proximately 15 cm.  Within this there is a very small, 2 skin tears with just minor gaping.  The length of these is  approximately 5 mm. Minor abrasion to the left inner elbow. Tenderness over the left kneecap.  No gross effusion at this time.  Developing hematoma.  No abrasion or skin disruption.  Skin:    General: Skin is warm and dry.  Neurological:  General: No focal deficit present.     Mental Status: She is alert and oriented to person, place, and time.     Cranial Nerves: No cranial nerve deficit.     Motor: No weakness.  Psychiatric:        Mood and Affect: Mood normal.    ED Results / Procedures / Treatments   Labs (all labs ordered are listed, but only abnormal results are displayed) Labs Reviewed - No data to display  EKG None  Radiology CT Head Wo Contrast  Result Date: 12/29/2020 CLINICAL DATA:  Fall EXAM: CT HEAD WITHOUT CONTRAST TECHNIQUE: Contiguous axial images were obtained from the base of the skull through the vertex without intravenous contrast. COMPARISON:  None. FINDINGS: Brain: No acute intracranial abnormality. Specifically, no hemorrhage, hydrocephalus, mass lesion, acute infarction, or significant intracranial injury. Vascular: No hyperdense vessel or unexpected calcification. Skull: No acute calvarial abnormality. Sinuses/Orbits: No acute findings Other: None IMPRESSION: No acute intracranial abnormality. Electronically Signed   By: Rolm Baptise M.D.   On: 12/29/2020 21:57   CT Cervical Spine Wo Contrast  Result Date: 12/29/2020 CLINICAL DATA:  Fall EXAM: CT CERVICAL SPINE WITHOUT CONTRAST TECHNIQUE: Multidetector CT imaging of the cervical spine was performed without intravenous contrast. Multiplanar CT image reconstructions were also generated. COMPARISON:  None. FINDINGS: Alignment: No subluxation. Skull base and vertebrae: No acute fracture. No primary bone lesion or focal pathologic process. Soft tissues and spinal canal: No prevertebral fluid or swelling. No visible canal hematoma. Disc levels: Diffuse degenerative disc disease, most pronounced at C6-7. Severe  diffuse bilateral degenerative facet disease. Moderate to severe multilevel bilateral neural foraminal narrowing diffusely. Upper chest: No acute findings Other: None IMPRESSION: Degenerative disc and facet disease as above. No acute bony abnormality. Electronically Signed   By: Rolm Baptise M.D.   On: 12/29/2020 21:58   DG Knee Complete 4 Views Left  Result Date: 12/29/2020 CLINICAL DATA:  Fall, landing on left side EXAM: LEFT KNEE - COMPLETE 4+ VIEW COMPARISON:  None. FINDINGS: Joint space narrowing most notable in the patellofemoral compartment with early spurring. No joint effusion. No acute bony abnormality. Specifically, no fracture, subluxation, or dislocation. IMPRESSION: Mild degenerative changes.  No acute bony abnormality. Electronically Signed   By: Rolm Baptise M.D.   On: 12/29/2020 21:54   DG Hand Complete Left  Result Date: 12/29/2020 CLINICAL DATA:  Fall, landed on left side EXAM: LEFT HAND - COMPLETE 3+ VIEW COMPARISON:  None. FINDINGS: No acute bony abnormality. Specifically, no fracture, subluxation, or dislocation. Joint space narrowing in the IP joints, 1st carpometacarpal joint, and radiocarpal joint. IMPRESSION: No acute bony abnormality. Electronically Signed   By: Rolm Baptise M.D.   On: 12/29/2020 22:30    Procedures .Marland KitchenLaceration Repair  Date/Time: 12/29/2020 11:33 PM Performed by: Charlesetta Shanks, MD Authorized by: Charlesetta Shanks, MD   Consent:    Consent obtained:  Verbal   Consent given by:  Patient   Risks discussed:  Infection and pain Anesthesia:    Anesthesia method:  Local infiltration   Local anesthetic:  Lidocaine 1% w/o epi Laceration details:    Location:  Ear   Ear location:  L ear   Length (cm):  1   Depth (mm):  5 Pre-procedure details:    Preparation:  Patient was prepped and draped in usual sterile fashion Exploration:    Limited defect created (wound extended): no     Hemostasis achieved with:  Direct pressure   Wound extent: areolar  tissue  violated     Contaminated: no   Treatment:    Area cleansed with:  Povidone-iodine   Amount of cleaning:  Standard   Debridement:  Minimal   Undermining:  None Skin repair:    Repair method:  Sutures   Suture size:  4-0   Suture material:  Fast-absorbing gut   Suture technique:  Simple interrupted   Number of sutures:  2 Approximation:    Approximation:  Close Repair type:    Repair type:  Complex Post-procedure details:    Dressing:  Antibiotic ointment   Procedure completion:  Tolerated well, no immediate complications Comments:     Patient has irregular laceration to the back of the earlobe.  Cleaned and minimally debrided.  2 sutures of 4-0 Vicryl Rapide used for closure   Medications Ordered in ED Medications  lidocaine (PF) (XYLOCAINE) 1 % injection 5 mL (5 mLs Infiltration Given by Other 12/29/20 2155)  acetaminophen (TYLENOL) tablet 1,000 mg (1,000 mg Oral Given 12/29/20 2155)  Tdap (BOOSTRIX) injection 0.5 mL (0.5 mLs Intramuscular Given 12/29/20 2327)  traMADol (ULTRAM) tablet 50 mg (50 mg Oral Given 12/29/20 2326)    ED Course  I have reviewed the triage vital signs and the nursing notes.  Pertinent labs & imaging results that were available during my care of the patient were reviewed by me and considered in my medical decision making (see chart for details).    MDM Rules/Calculators/A&P                          Patient had a fall as outlined.  CT does not show acute findings.  X-rays do not show any acute fractures.  Patient had laceration on the earlobe requiring repair as outlined.  She had abrasions on the extremities consistent with skin tears and superficial abrasions managed topically.  Patient has contusion to her left hand and knee.  Will treat with Tylenol and tramadol for pain control.  Return precautions reviewed. Final Clinical Impression(s) / ED Diagnoses Final diagnoses:  Fall, initial encounter  Ear lobe laceration, left, initial encounter   Multiple contusions    Rx / DC Orders ED Discharge Orders          Ordered    traMADol (ULTRAM) 50 MG tablet        12/29/20 2330             Charlesetta Shanks, MD 12/29/20 2336

## 2020-12-29 NOTE — ED Triage Notes (Signed)
Reports she fell on the patio.  Unsure if she slipped or tripped.  Hit a planter that cut the right forearm.  Also has a lac to the left ear lobe.  Reports she hit the left side of her face on the porch.  Denies any LOC.  Not on any blood thinners.

## 2021-01-02 DIAGNOSIS — L648 Other androgenic alopecia: Secondary | ICD-10-CM | POA: Diagnosis not present

## 2021-02-08 DIAGNOSIS — G4733 Obstructive sleep apnea (adult) (pediatric): Secondary | ICD-10-CM | POA: Diagnosis not present

## 2021-02-12 DIAGNOSIS — Z9989 Dependence on other enabling machines and devices: Secondary | ICD-10-CM | POA: Diagnosis not present

## 2021-02-12 DIAGNOSIS — G4733 Obstructive sleep apnea (adult) (pediatric): Secondary | ICD-10-CM | POA: Diagnosis not present

## 2021-03-06 DIAGNOSIS — M25512 Pain in left shoulder: Secondary | ICD-10-CM | POA: Diagnosis not present

## 2021-03-08 DIAGNOSIS — M25612 Stiffness of left shoulder, not elsewhere classified: Secondary | ICD-10-CM | POA: Diagnosis not present

## 2021-03-08 DIAGNOSIS — M25512 Pain in left shoulder: Secondary | ICD-10-CM | POA: Diagnosis not present

## 2021-03-08 DIAGNOSIS — S46012D Strain of muscle(s) and tendon(s) of the rotator cuff of left shoulder, subsequent encounter: Secondary | ICD-10-CM | POA: Diagnosis not present

## 2021-03-11 DIAGNOSIS — M25512 Pain in left shoulder: Secondary | ICD-10-CM | POA: Diagnosis not present

## 2021-03-11 DIAGNOSIS — M25612 Stiffness of left shoulder, not elsewhere classified: Secondary | ICD-10-CM | POA: Diagnosis not present

## 2021-03-11 DIAGNOSIS — S46012D Strain of muscle(s) and tendon(s) of the rotator cuff of left shoulder, subsequent encounter: Secondary | ICD-10-CM | POA: Diagnosis not present

## 2021-03-22 DIAGNOSIS — M25512 Pain in left shoulder: Secondary | ICD-10-CM | POA: Diagnosis not present

## 2021-03-22 DIAGNOSIS — M25612 Stiffness of left shoulder, not elsewhere classified: Secondary | ICD-10-CM | POA: Diagnosis not present

## 2021-03-22 DIAGNOSIS — S46012D Strain of muscle(s) and tendon(s) of the rotator cuff of left shoulder, subsequent encounter: Secondary | ICD-10-CM | POA: Diagnosis not present

## 2021-03-27 DIAGNOSIS — M25512 Pain in left shoulder: Secondary | ICD-10-CM | POA: Diagnosis not present

## 2021-03-27 DIAGNOSIS — M25612 Stiffness of left shoulder, not elsewhere classified: Secondary | ICD-10-CM | POA: Diagnosis not present

## 2021-03-27 DIAGNOSIS — S46012D Strain of muscle(s) and tendon(s) of the rotator cuff of left shoulder, subsequent encounter: Secondary | ICD-10-CM | POA: Diagnosis not present

## 2021-03-29 ENCOUNTER — Other Ambulatory Visit: Payer: Self-pay

## 2021-03-29 ENCOUNTER — Ambulatory Visit
Admission: RE | Admit: 2021-03-29 | Discharge: 2021-03-29 | Disposition: A | Discharge status: Home / Self Care | Payer: PPO | Source: Ambulatory Visit | Attending: Family Medicine | Admitting: Family Medicine

## 2021-03-29 VITALS — BP 152/82 | HR 66 | Temp 97.5°F | Resp 17

## 2021-03-29 DIAGNOSIS — L309 Dermatitis, unspecified: Secondary | ICD-10-CM

## 2021-03-29 MED ORDER — CLOTRIMAZOLE-BETAMETHASONE 1-0.05 % EX CREA: TOPICAL_CREAM | CUTANEOUS | 0 refills | Status: DC

## 2021-03-29 NOTE — ED Provider Notes (Signed)
[facility]    CSN: [id number] Arrival date & time: 03/29/21  1256      History   Chief Complaint Chief Complaint  Patient presents with   Rash    APPT    HPI Diane Macias is a 75 y.o. female.   HPI Patient presents today with rash to the palmar surface of the right forearm. She is concerned that it may be ringworms as her husband recently was treated for ringworm and their cat is currently being treated.  She awakened this morning with a red and raised area on the right forearm and applied some of her husband's cream which significantly improved rash.  She still has some residual redness and itching and she is here for evaluation. Past Medical History:  Diagnosis Date   Adenomatous colon polyp 04/18/2011   Allergy    Arthritis    Cancer ([place]) 1997   squamous and basal cell   Diabetes mellitus    type II   Hyperlipidemia    Hypertension    Osteopenia    Sleep apnea    CPAP    Patient Active Problem List   Diagnosis Date Noted   Malignant neoplasm of overlapping sites of right breast in female, estrogen receptor positive ([place]) 03/17/2017   Diabetes mellitus type II, controlled ([place]) 11/05/2016   Diverticulosis 11/05/2016   Essential hypertension 11/05/2016   Fatty liver 11/05/2016   Hyperlipemia 11/05/2016   Obesity (BMI 30-39.9) 11/05/2016   OSA on CPAP 11/05/2016   Initial Medicare annual wellness visit 03/26/2016   Referred otalgia of right ear 09/03/2015   Sensorineural hearing loss 09/03/2015   Incomplete tear of right rotator cuff 12/09/2014   Impingement syndrome of right shoulder 11/30/2014   Tendinitis of right rotator cuff 11/30/2014   Lower urinary tract infectious disease 03/09/2014   Mixed incontinence 03/09/2014   CARPAL TUNNEL SYNDROME 01/02/2009   INTERNAL HEMORRHOIDS 01/02/2009   DIVERTICULOSIS, COLON 01/02/2009   FATTY LIVER DISEASE 01/02/2009   ENDOMETRIOSIS 01/02/2009   OSTEOPOROSIS 01/02/2009   SKIN CANCER, HX OF 01/02/2009    COLONIC POLYPS, ADENOMATOUS, HX OF 01/02/2009   DIVERTICULITIS, HX OF 01/02/2009    Past Surgical History:  Procedure Laterality Date   arthroscopic knee  01/2008   right knee   CATARACT EXTRACTION Left 2016   CHOLECYSTECTOMY  1981   COLONOSCOPY     FOOT SURGERY  07/2007   right foot (BUNION)   KNEE ARTHROSCOPY     left   TONSILLECTOMY  1953   torn maniscus  11/2010   left knee   VAGINAL HYSTERECTOMY  1994    OB History   No obstetric history on file.      Home Medications    Prior to Admission medications   Medication Sig Start Date End Date Taking? Authorizing Provider  clotrimazole-betamethasone (LOTRISONE) cream Apply to affected area 2 times daily prn 03/29/21  Yes Scot Jun, FNP  cholecalciferol (VITAMIN D) 1000 UNITS tablet Take 1,000 Units by mouth daily.      [provider]  exemestane (AROMASIN) 25 MG tablet TAKE 1 TABLET(25 MG) BY MOUTH DAILY AFTER AND BREAKFAST 11/30/20   Magrinat, Virgie Dad, MD  glucose blood test strip 1 each by Other route as needed for other. Use as instructed    [provider]  lisinopril (PRINIVIL,ZESTRIL) 10 MG tablet Take 10 mg by mouth daily.      [provider]  metFORMIN (GLUCOPHAGE) 500 MG tablet Take 500 mg by  mouth daily after supper.      [provider]  ONE TOUCH LANCETS MISC by Does not apply route.    [provider]  Probiotic Product (ALIGN PO) Take 1 capsule by mouth daily.    [provider]  rosuvastatin (CRESTOR) 5 MG tablet Take 1 tablet (5 mg total) by mouth daily. 06/07/20   Magrinat, Virgie Dad, MD  traMADol Veatrice Bourbon) 50 MG tablet 1-2 tablets every 6 hours as needed for pain 12/29/20   Charlesetta Shanks, MD  vitamin B-12 (CYANOCOBALAMIN) 1000 MCG tablet Take 1,000 mcg by mouth daily.    [provider]    Family History Family History  Problem Relation Age of Onset   Lung cancer Father 60   Colon cancer Neg Hx     Social History Social History    Tobacco Use   Smoking status: Former    Types: Cigarettes    Quit date: 07/21/1968    Years since quitting: 52.7   Smokeless tobacco: Never  Substance Use Topics   Alcohol use: Yes    Comment: rarely wine   Drug use: No     Allergies   Ciprofloxacin, Flagyl [metronidazole], Celecoxib, Diazepam, Hydrocodone, Oxycodone, Sulfa antibiotics, and Sulfonamide derivatives   Review of Systems Review of Systems Pertinent negatives listed in HPI   Physical Exam Triage Vital Signs ED Triage Vitals  Enc Vitals Group     BP 03/29/21 1323 (!) 152/82     Pulse Rate 03/29/21 1323 66     Resp 03/29/21 1323 17     Temp 03/29/21 1323 (!) 97.5 F (36.4 C)     Temp Source 03/29/21 1323 Oral     SpO2 03/29/21 1323 96 %     Weight --      Height --      Head Circumference --      Peak Flow --      Pain Score 03/29/21 1324 0     Pain Loc --      Pain Edu? --      Excl. in [place]? --    No data found.  Updated Vital Signs BP (!) 152/82 (BP Location: Left Arm)   Pulse 66   Temp (!) 97.5 F (36.4 C) (Oral)   Resp 17   SpO2 96%   Visual Acuity Right Eye Distance:   Left Eye Distance:   Bilateral Distance:    Right Eye Near:   Left Eye Near:    Bilateral Near:     Physical Exam Constitutional:      Appearance: Normal appearance.  Cardiovascular:     Rate and Rhythm: Normal rate and regular rhythm.  Pulmonary:     Effort: Pulmonary effort is normal.     Breath sounds: Normal breath sounds.  Skin:    General: Skin is warm.     Findings: Rash present.     Comments: Erythematous localized macular rash right forearm  Neurological:     General: No focal deficit present.     Mental Status: She is alert and oriented to person, place, and time.  Psychiatric:        Mood and Affect: Mood normal.     UC Treatments / Results  Labs (all labs ordered are listed, but only abnormal results are displayed) Labs Reviewed - No data to display  EKG   Radiology No results  found.  Procedures Procedures (including critical care time)  Medications Ordered in UC Medications - No data to display  Initial Impression / Assessment and Plan / UC Course  I have reviewed the triage vital signs and the nursing notes.  Pertinent labs & imaging results that were available during my care of the patient were reviewed by me and considered in my medical decision making (see chart for details).    Dermatitis, appearance of rash is not that of tinea however will cover with Lotrisone which has both antifungal and steroid coverage.  She will apply twice daily for 5 days.  Return precautions given.   Final Clinical Impressions(s) / UC Diagnoses   Final diagnoses:  Dermatitis     Discharge Instructions      Apply twice daily morning and prior to bedtime for 5 days.      ED Prescriptions     Medication Sig Dispense Auth. Provider   clotrimazole-betamethasone (LOTRISONE) cream Apply to affected area 2 times daily prn 45 g Scot Jun, FNP      PDMP not reviewed this encounter.   Scot Jun, FNP 03/29/21 1401

## 2021-03-29 NOTE — ED Triage Notes (Signed)
Pt presents with rash on right forearm xs 2-3 days. States husband and cat have ringworm.

## 2021-03-29 NOTE — Discharge Instructions (Addendum)
Apply twice daily morning and prior to bedtime for 5 days.

## 2021-04-01 DIAGNOSIS — S46012D Strain of muscle(s) and tendon(s) of the rotator cuff of left shoulder, subsequent encounter: Secondary | ICD-10-CM | POA: Diagnosis not present

## 2021-04-01 DIAGNOSIS — M25512 Pain in left shoulder: Secondary | ICD-10-CM | POA: Diagnosis not present

## 2021-04-01 DIAGNOSIS — M25612 Stiffness of left shoulder, not elsewhere classified: Secondary | ICD-10-CM | POA: Diagnosis not present

## 2021-04-03 DIAGNOSIS — Z961 Presence of intraocular lens: Secondary | ICD-10-CM | POA: Diagnosis not present

## 2021-04-03 DIAGNOSIS — H02831 Dermatochalasis of right upper eyelid: Secondary | ICD-10-CM | POA: Diagnosis not present

## 2021-04-03 DIAGNOSIS — H26493 Other secondary cataract, bilateral: Secondary | ICD-10-CM | POA: Diagnosis not present

## 2021-04-03 DIAGNOSIS — Z7984 Long term (current) use of oral hypoglycemic drugs: Secondary | ICD-10-CM | POA: Diagnosis not present

## 2021-04-03 DIAGNOSIS — H52203 Unspecified astigmatism, bilateral: Secondary | ICD-10-CM | POA: Diagnosis not present

## 2021-04-03 DIAGNOSIS — E119 Type 2 diabetes mellitus without complications: Secondary | ICD-10-CM | POA: Diagnosis not present

## 2021-04-03 DIAGNOSIS — H02834 Dermatochalasis of left upper eyelid: Secondary | ICD-10-CM | POA: Diagnosis not present

## 2021-04-03 DIAGNOSIS — H35411 Lattice degeneration of retina, right eye: Secondary | ICD-10-CM | POA: Diagnosis not present

## 2021-04-03 DIAGNOSIS — H524 Presbyopia: Secondary | ICD-10-CM | POA: Diagnosis not present

## 2021-04-03 DIAGNOSIS — H43813 Vitreous degeneration, bilateral: Secondary | ICD-10-CM | POA: Diagnosis not present

## 2021-04-03 DIAGNOSIS — D3132 Benign neoplasm of left choroid: Secondary | ICD-10-CM | POA: Diagnosis not present

## 2021-04-03 DIAGNOSIS — H5213 Myopia, bilateral: Secondary | ICD-10-CM | POA: Diagnosis not present

## 2021-04-10 DIAGNOSIS — S46012D Strain of muscle(s) and tendon(s) of the rotator cuff of left shoulder, subsequent encounter: Secondary | ICD-10-CM | POA: Diagnosis not present

## 2021-04-10 DIAGNOSIS — M25512 Pain in left shoulder: Secondary | ICD-10-CM | POA: Diagnosis not present

## 2021-04-10 DIAGNOSIS — M25612 Stiffness of left shoulder, not elsewhere classified: Secondary | ICD-10-CM | POA: Diagnosis not present

## 2021-04-16 DIAGNOSIS — M25612 Stiffness of left shoulder, not elsewhere classified: Secondary | ICD-10-CM | POA: Diagnosis not present

## 2021-04-16 DIAGNOSIS — S46012D Strain of muscle(s) and tendon(s) of the rotator cuff of left shoulder, subsequent encounter: Secondary | ICD-10-CM | POA: Diagnosis not present

## 2021-04-16 DIAGNOSIS — M25512 Pain in left shoulder: Secondary | ICD-10-CM | POA: Diagnosis not present

## 2021-04-17 DIAGNOSIS — M25562 Pain in left knee: Secondary | ICD-10-CM | POA: Diagnosis not present

## 2021-04-23 DIAGNOSIS — Q998 Other specified chromosome abnormalities: Secondary | ICD-10-CM | POA: Diagnosis not present

## 2021-04-23 DIAGNOSIS — C50411 Malignant neoplasm of upper-outer quadrant of right female breast: Secondary | ICD-10-CM | POA: Diagnosis not present

## 2021-04-23 DIAGNOSIS — Z9889 Other specified postprocedural states: Secondary | ICD-10-CM | POA: Diagnosis not present

## 2021-04-23 DIAGNOSIS — Z923 Personal history of irradiation: Secondary | ICD-10-CM | POA: Diagnosis not present

## 2021-04-23 DIAGNOSIS — Z170 Estrogen receptor positive status [ER+]: Secondary | ICD-10-CM | POA: Diagnosis not present

## 2021-04-23 DIAGNOSIS — Z79811 Long term (current) use of aromatase inhibitors: Secondary | ICD-10-CM | POA: Diagnosis not present

## 2021-04-24 DIAGNOSIS — S46012D Strain of muscle(s) and tendon(s) of the rotator cuff of left shoulder, subsequent encounter: Secondary | ICD-10-CM | POA: Diagnosis not present

## 2021-04-24 DIAGNOSIS — M25612 Stiffness of left shoulder, not elsewhere classified: Secondary | ICD-10-CM | POA: Diagnosis not present

## 2021-04-24 DIAGNOSIS — M25512 Pain in left shoulder: Secondary | ICD-10-CM | POA: Diagnosis not present

## 2021-04-30 DIAGNOSIS — M25612 Stiffness of left shoulder, not elsewhere classified: Secondary | ICD-10-CM | POA: Diagnosis not present

## 2021-04-30 DIAGNOSIS — M25512 Pain in left shoulder: Secondary | ICD-10-CM | POA: Diagnosis not present

## 2021-04-30 DIAGNOSIS — S46012D Strain of muscle(s) and tendon(s) of the rotator cuff of left shoulder, subsequent encounter: Secondary | ICD-10-CM | POA: Diagnosis not present

## 2021-05-01 DIAGNOSIS — N3946 Mixed incontinence: Secondary | ICD-10-CM | POA: Diagnosis not present

## 2021-05-01 DIAGNOSIS — N952 Postmenopausal atrophic vaginitis: Secondary | ICD-10-CM | POA: Diagnosis not present

## 2021-05-01 DIAGNOSIS — Z853 Personal history of malignant neoplasm of breast: Secondary | ICD-10-CM | POA: Diagnosis not present

## 2021-05-03 ENCOUNTER — Telehealth: Payer: Self-pay | Admitting: *Deleted

## 2021-05-03 NOTE — Telephone Encounter (Signed)
Received call from pt asking if OK to use clobetasol cream to vaginal area prescribed by PCP for urgency.  She was concerned b/c she is on an anti-estrogen.  Verified with Suzan Garibaldi NP & notified pt that it was OK.

## 2021-05-07 DIAGNOSIS — M25512 Pain in left shoulder: Secondary | ICD-10-CM | POA: Diagnosis not present

## 2021-05-07 DIAGNOSIS — S46012D Strain of muscle(s) and tendon(s) of the rotator cuff of left shoulder, subsequent encounter: Secondary | ICD-10-CM | POA: Diagnosis not present

## 2021-05-07 DIAGNOSIS — M25612 Stiffness of left shoulder, not elsewhere classified: Secondary | ICD-10-CM | POA: Diagnosis not present

## 2021-05-11 DIAGNOSIS — G4733 Obstructive sleep apnea (adult) (pediatric): Secondary | ICD-10-CM | POA: Diagnosis not present

## 2021-05-29 DIAGNOSIS — M25612 Stiffness of left shoulder, not elsewhere classified: Secondary | ICD-10-CM | POA: Diagnosis not present

## 2021-05-29 DIAGNOSIS — M25512 Pain in left shoulder: Secondary | ICD-10-CM | POA: Diagnosis not present

## 2021-05-29 DIAGNOSIS — S46012D Strain of muscle(s) and tendon(s) of the rotator cuff of left shoulder, subsequent encounter: Secondary | ICD-10-CM | POA: Diagnosis not present

## 2021-05-30 DIAGNOSIS — H9313 Tinnitus, bilateral: Secondary | ICD-10-CM | POA: Diagnosis not present

## 2021-05-30 DIAGNOSIS — Z87891 Personal history of nicotine dependence: Secondary | ICD-10-CM | POA: Diagnosis not present

## 2021-05-31 DIAGNOSIS — H903 Sensorineural hearing loss, bilateral: Secondary | ICD-10-CM | POA: Diagnosis not present

## 2021-06-04 DIAGNOSIS — L821 Other seborrheic keratosis: Secondary | ICD-10-CM | POA: Diagnosis not present

## 2021-06-04 DIAGNOSIS — L578 Other skin changes due to chronic exposure to nonionizing radiation: Secondary | ICD-10-CM | POA: Diagnosis not present

## 2021-06-04 DIAGNOSIS — L84 Corns and callosities: Secondary | ICD-10-CM | POA: Diagnosis not present

## 2021-06-04 DIAGNOSIS — Z129 Encounter for screening for malignant neoplasm, site unspecified: Secondary | ICD-10-CM | POA: Diagnosis not present

## 2021-06-05 DIAGNOSIS — Z0000 Encounter for general adult medical examination without abnormal findings: Secondary | ICD-10-CM | POA: Diagnosis not present

## 2021-06-05 DIAGNOSIS — Z23 Encounter for immunization: Secondary | ICD-10-CM | POA: Diagnosis not present

## 2021-06-05 DIAGNOSIS — M810 Age-related osteoporosis without current pathological fracture: Secondary | ICD-10-CM | POA: Diagnosis not present

## 2021-06-05 DIAGNOSIS — Z9989 Dependence on other enabling machines and devices: Secondary | ICD-10-CM | POA: Diagnosis not present

## 2021-06-05 DIAGNOSIS — Z7984 Long term (current) use of oral hypoglycemic drugs: Secondary | ICD-10-CM | POA: Diagnosis not present

## 2021-06-05 DIAGNOSIS — I10 Essential (primary) hypertension: Secondary | ICD-10-CM | POA: Diagnosis not present

## 2021-06-05 DIAGNOSIS — C50411 Malignant neoplasm of upper-outer quadrant of right female breast: Secondary | ICD-10-CM | POA: Diagnosis not present

## 2021-06-05 DIAGNOSIS — E119 Type 2 diabetes mellitus without complications: Secondary | ICD-10-CM | POA: Diagnosis not present

## 2021-06-05 DIAGNOSIS — Z170 Estrogen receptor positive status [ER+]: Secondary | ICD-10-CM | POA: Diagnosis not present

## 2021-06-05 DIAGNOSIS — E782 Mixed hyperlipidemia: Secondary | ICD-10-CM | POA: Diagnosis not present

## 2021-06-05 DIAGNOSIS — G4733 Obstructive sleep apnea (adult) (pediatric): Secondary | ICD-10-CM | POA: Diagnosis not present

## 2021-06-09 NOTE — Progress Notes (Signed)
[place]  [phone] [phone]     ID: JHENE WESTMORELAND DOB: 1945-10-26  MR#: [id number]  JYN#:[id number]  Patient Care Team: Loraine Leriche., MD as PCP - General (Internal Medicine) Angelina Ok, MD as Referring Physician (Surgery) Axel Meas, Virgie Dad, MD as Consulting Physician (Oncology) Thea Silversmith, MD as Referring Physician (Radiation Oncology) Mauri Pole, MD as Consulting Physician (Gastroenterology) OTHER MD:   CHIEF COMPLAINT: Estrogen receptor positive lobular breast cancer   CURRENT TREATMENT: [Exemestane]   INTERVAL HISTORY: Mariaeduarda returns today for follow up of her estrogen receptor positive lobular breast cancer.   She continues on exemestane.  She has had some hair thinning due to this but in addition it is worsening the vaginal dryness problems.  She is using emollients drinking more water, and doing everything she can but she is having itching, crustiness, and urinary discomfort from the vaginal atrophy issue.  She tells me her primary MD obtained a urinalysis which was "okay", with no evidence of infection.  Her most recent bone density testing on 04/26/2018 at [facility] showed a T-score of -1.5, which is mildly osteopenic.  Since her last visit, she underwent bilateral diagnostic mammography with tomography at [facility] on 04/23/2021 showing: breast composition category A; no evidence of malignancy in either breast.    REVIEW OF SYSTEMS: Ziyana exercises chiefly by walking.  She also does housework and tells me most days she gets 8000 steps.  A detailed review of systems today is benign except as noted above   COVID 19 VACCINATION STATUS: Status post Carl x2+ booster September 2021   HISTORY OF CURRENT ILLNESS: From the original intake note:  Elias had bilateral screening mammography at [facility] 11/30/2014 showing the breast density to be category B. There was a possible mass in the right breast. On  12/10/2016 she underwent right diagnostic mammography with tomography and right breast ultrasonography. This confirmed a 0.4 cm spiculated mass in the periareolar area of the right breast. Ultrasound showed a small irregular hypoechoic mass at the 9:00 position 1 cm from the nipple, measuring 0.5 cm. Ultrasound of the right axilla was benign.  Biopsy of the right breast area in question 12/25/2016 showed an invasive lobular carcinoma, grade 2, estrogen receptor positive at 85%, progesterone receptor positive at 65%, both with strong staining intensity, and HER-2 not amplified by immunohistochemistry (1+).   On 01/19/2017 she underwent bilateral breast MRIs which showed in addition to the known right breast mass an additional area of concern in the central right breast. This measured 0.6 cm. MRI guided biopsy 02/06/2017 confirmed that this second area also was invasive lobular carcinoma, grade 2. It was located (by MRI) 1.5 cm inferior medial to the previously biopsied mass. Also there was a 0.6 mm sternal lesion in the inferior aspect of the sternum.  Because of the sternal lesion on the patient underwent PET scanning 02/12/2017. This was entirely negative and the sternal lesion is felt to be most consistent with a bone island  On 02/20/2017 the patient underwent right lumpectomy. This showed ([id number]) invasive lobular carcinoma measuring 0.5 cm, grade 2, with evidence of lobular carcinoma in situ. Margins were clear. Both sentinel lymph nodes were clear. [NB: the second biopsied lesion was present in the surgical sample but not addressed in the report; I have discussed this with pathology and Dr Catha Brow has kindly agreed to review the case, measure this lesion and margins, and dictate an addendum]  The patient's subsequent history is as  detailed below.   PAST MEDICAL HISTORY: Past Medical History:  Diagnosis Date   Adenomatous colon polyp 04/18/2011   Allergy    Arthritis    Cancer ([place]) 1997    squamous and basal cell   Diabetes mellitus    type II   Hyperlipidemia    Hypertension    Osteopenia    Sleep apnea    CPAP    PAST SURGICAL HISTORY: Past Surgical History:  Procedure Laterality Date   arthroscopic knee  01/2008   right knee   CATARACT EXTRACTION Left 2016   CHOLECYSTECTOMY  1981   COLONOSCOPY     FOOT SURGERY  07/2007   right foot (BUNION)   KNEE ARTHROSCOPY     left   TONSILLECTOMY  1953   torn maniscus  11/2010   left knee   VAGINAL HYSTERECTOMY  1994    FAMILY HISTORY Family History  Problem Relation Age of Onset   Lung cancer Father 34   Colon cancer Neg Hx   The patient's father died from lung cancer in the setting of tobacco abuse. He was 75 years old. The patient's mother died at 59 with Alzheimer's disease. The patient had no brothers, 2 sisters. One sister was diagnosed with breast cancer at age 95. There is also a maternal cousin with breast cancer. There is no history of ovarian or prostate cancer in the family. The patient was genetically tested at [facility] and reportedly carries a heterozygous MutYH mutation. I do have requested that report   GYNECOLOGIC HISTORY:  No LMP recorded. Patient has had a hysterectomy. Menarche age 71, first live birth age 56, the patient is [place] P2. She underwent total abdominal hysterectomy with bilateral salpingo-oophorectomy approximately age 52. She took hormone replacement for approximately 17 years.   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 [name] is a retired Scientist, water quality. Daughter [name] is a Risk manager in [place]. Daughter [name] is Web designer at the country day school locally. The patient has 3 grandchildren. She is a Tourist information centre manager.    ADVANCED DIRECTIVES:    HEALTH MAINTENANCE: Social History   Tobacco Use   Smoking status: Former    Types: Cigarettes    Quit date: 07/21/1968    Years since quitting: 52.9   Smokeless tobacco: Never  Substance  Use Topics   Alcohol use: Yes    Comment: rarely wine   Drug use: No     Colonoscopy: May 2018; Nandigam  PAP:  Bone density:   Allergies  Allergen Reactions   Ciprofloxacin Swelling and Rash    other   Flagyl [Metronidazole] Swelling   Celecoxib Swelling   Diazepam Other (See Comments)    dpression   Hydrocodone Other (See Comments)    Could not sleep   Oxycodone Other (See Comments)    Restless   Sulfa Antibiotics Rash   Sulfonamide Derivatives Rash    Current Outpatient Medications  Medication Sig Dispense Refill   cholecalciferol (VITAMIN D) 1000 UNITS tablet Take 1,000 Units by mouth daily.       clotrimazole-betamethasone (LOTRISONE) cream Apply to affected area 2 times daily prn 45 g 0   glucose blood test strip 1 each by Other route as needed for other. Use as instructed     lisinopril (PRINIVIL,ZESTRIL) 10 MG tablet Take 10 mg by mouth daily.       metFORMIN (GLUCOPHAGE) 500 MG tablet Take 500 mg by mouth daily after supper.  ONE TOUCH LANCETS MISC by Does not apply route.     Probiotic Product (ALIGN PO) Take 1 capsule by mouth daily.     rosuvastatin (CRESTOR) 5 MG tablet Take 1 tablet (5 mg total) by mouth daily.     traMADol (ULTRAM) 50 MG tablet 1-2 tablets every 6 hours as needed for pain 20 tablet 0   vitamin B-12 (CYANOCOBALAMIN) 1000 MCG tablet Take 1,000 mcg by mouth daily.     Current Facility-Administered Medications  Medication Dose Route Frequency Provider Last Rate Last Admin   0.9 %  sodium chloride infusion  500 mL Intravenous Continuous Nandigam, Venia Minks, MD        OBJECTIVE: White woman in no acute distress  Vitals:   06/10/21 1205  BP: (!) 163/66  Pulse: 70  Resp: 18  Temp: 97.7 F (36.5 C)  SpO2: 99%      Body mass index is 32.76 kg/m.   Wt Readings from Last 3 Encounters:  06/10/21 179 lb 1.6 oz (81.2 kg)  12/29/20 174 lb (78.9 kg)  06/07/20 170 lb 1.6 oz (77.2 kg)     ECOG FS:1 - Symptomatic but completely  ambulatory  Sclerae unicteric, EOMs intact Wearing a mask No cervical or supraclavicular adenopathy Lungs no rales or rhonchi Heart regular rate and rhythm Abd soft, nontender, positive bowel sounds MSK no focal spinal tenderness, no upper extremity lymphedema Neuro: nonfocal, well oriented, appropriate affect Breasts: The right breast has undergone lumpectomy followed by radiation.  There is no evidence of disease recurrence.  The left breast and both axillae are benign.   LAB RESULTS:  CMP     Component Value Date/Time   NA 140 03/01/2018 1026   NA 139 03/17/2017 1524   K 4.8 03/01/2018 1026   K 4.2 03/17/2017 1524   CL 108 03/01/2018 1026   CO2 23 03/01/2018 1026   CO2 23 03/17/2017 1524   GLUCOSE 101 (H) 03/01/2018 1026   GLUCOSE 102 03/17/2017 1524   BUN 27 (H) 03/01/2018 1026   BUN 28.6 (H) 03/17/2017 1524   CREATININE 1.01 (H) 03/01/2018 1026   CREATININE 0.9 03/17/2017 1524   CALCIUM 9.7 03/01/2018 1026   CALCIUM 9.8 03/17/2017 1524   PROT 7.0 03/01/2018 1026   PROT 6.8 03/17/2017 1524   ALBUMIN 3.6 03/01/2018 1026   ALBUMIN 3.8 03/17/2017 1524   AST 16 03/01/2018 1026   AST 15 03/17/2017 1524   ALT 17 03/01/2018 1026   ALT 16 03/17/2017 1524   ALKPHOS 90 03/01/2018 1026   ALKPHOS 88 03/17/2017 1524   BILITOT 0.4 03/01/2018 1026   BILITOT 0.31 03/17/2017 1524   GFRNONAA 54 (L) 03/01/2018 1026   GFRAA >60 03/01/2018 1026    Lab Results  Component Value Date   WBC 7.9 03/01/2018   NEUTROABS 4.5 03/01/2018   HGB 12.2 03/01/2018   HCT 37.8 03/01/2018   MCV 94.7 03/01/2018   PLT 176 03/01/2018      Chemistry      Component Value Date/Time   NA 140 03/01/2018 1026   NA 139 03/17/2017 1524   K 4.8 03/01/2018 1026   K 4.2 03/17/2017 1524   CL 108 03/01/2018 1026   CO2 23 03/01/2018 1026   CO2 23 03/17/2017 1524   BUN 27 (H) 03/01/2018 1026   BUN 28.6 (H) 03/17/2017 1524   CREATININE 1.01 (H) 03/01/2018 1026   CREATININE 0.9 03/17/2017 1524       Component Value Date/Time   CALCIUM 9.7  03/01/2018 1026   CALCIUM 9.8 03/17/2017 1524   ALKPHOS 90 03/01/2018 1026   ALKPHOS 88 03/17/2017 1524   AST 16 03/01/2018 1026   AST 15 03/17/2017 1524   ALT 17 03/01/2018 1026   ALT 16 03/17/2017 1524   BILITOT 0.4 03/01/2018 1026   BILITOT 0.31 03/17/2017 1524       No results found for: LABCA2  No components found for: [id number]  No results for input(s): INR in the last 168 hours.  No results found for: LABCA2  No results found for: YBO175  No results found for: ZWC585  No results found for: IDP824  No results found for: CA2729  No components found for: HGQUANT  No results found for: CEA1 / No results found for: CEA1   No results found for: AFPTUMOR  No results found for: CHROMOGRNA  No results found for: TOTALPROTELP, ALBUMINELP, A1GS, A2GS, BETS, BETA2SER, GAMS, MSPIKE, SPEI (this displays SPEP labs)  No results found for: KPAFRELGTCHN, LAMBDASER, KAPLAMBRATIO (kappa/lambda light chains)  No results found for: HGBA, HGBA2QUANT, HGBFQUANT, HGBSQUAN (Hemoglobinopathy evaluation)   No results found for: LDH  No results found for: IRON, TIBC, IRONPCTSAT (Iron and TIBC)  No results found for: FERRITIN  Urinalysis No results found for: COLORURINE, APPEARANCEUR, LABSPEC, PHURINE, GLUCOSEU, HGBUR, BILIRUBINUR, KETONESUR, PROTEINUR, UROBILINOGEN, NITRITE, LEUKOCYTESUR   STUDIES: No results found.   ELIGIBLE FOR AVAILABLE RESEARCH PROTOCOL: no  ASSESSMENT: 75 y.o. [place], [place] woman status post right lumpectomy 02/20/2017 for 2 separate areas of invasive lobular carcinoma, grade 2, only one addressed in the pathology report, that one being pT1a pN0, stage IA, estrogen and progesterone receptor strongly positive, HER-2 not amplified  (a) the second area of known invasive lobular carcinoma was also included in the surgical sample; its dimensions and margins were to be addressed in a pathology addendum  (1)  Oncotype not indicated and no chemotherapy is warranted  (2) adjuvant radiation to the right breast completed 05/25/2017  (3) exemestane started 07/27/2017, switched to exemestane April 2019  (a) bone density at [facility] 11/27/2015 shows a T score of +0.4  (b) repeat bone density 04/26/2018 shows a T score of -1.0   (4) per patient, she carries a heterozygous MutYH mutation   PLAN: Merilynn is now a little over 4 years out from definitive surgery for her very early stage breast cancer.  She has been on exemestane almost 4 years.  We discussed the fact that antiestrogens for T1a tumors are optional because the overall risk of systemic recurrence is so low.  She has likely already obtained most of the benefit she would obtain from exemestane and my recommendation given her vaginal dryness issues right now is to stop it.  If after 2 or 3 months the vaginal dryness persists 1 option would be to go to tamoxifen and add vaginal estrogens.  In the meantime though I am placing a referral to our pelvic rehab group and I think that will be helpful to her.  She will see [place] again in a few months.  At that time depending on how things are going she can consider going back to exemestane, opting for observation only, and/or switching to survivorship.  Total encounter time 25 minutes.*   Savino Whisenant, Virgie Dad, MD  06/10/21 6:07 PM Medical Oncology and Hematology [facility] [place], [place] [address] Tel. [phone]    Fax. [phone]   I, Wilburn Mylar, am acting as scribe for Dr. Virgie Dad. Alyanna Stoermer.  I, Lurline Del MD, have reviewed the above documentation for accuracy and completeness, and I agree with the above.   *Total Encounter Time as defined by the Centers for Medicare and Medicaid Services includes, in addition to the face-to-face time of a patient visit (documented in the note above) non-face-to-face time: obtaining and reviewing outside history,  ordering and reviewing medications, tests or procedures, care coordination (communications with other health care professionals or caregivers) and documentation in the medical record.

## 2021-06-10 ENCOUNTER — Other Ambulatory Visit: Payer: Self-pay

## 2021-06-10 ENCOUNTER — Inpatient Hospital Stay: Payer: PPO

## 2021-06-10 ENCOUNTER — Inpatient Hospital Stay: Payer: PPO | Attending: Oncology | Admitting: Oncology

## 2021-06-10 VITALS — BP 163/66 | HR 70 | Temp 97.7°F | Resp 18 | Ht 62.0 in | Wt 179.1 lb

## 2021-06-10 DIAGNOSIS — Z170 Estrogen receptor positive status [ER+]: Secondary | ICD-10-CM | POA: Insufficient documentation

## 2021-06-10 DIAGNOSIS — Z881 Allergy status to other antibiotic agents status: Secondary | ICD-10-CM | POA: Insufficient documentation

## 2021-06-10 DIAGNOSIS — Z923 Personal history of irradiation: Secondary | ICD-10-CM | POA: Diagnosis not present

## 2021-06-10 DIAGNOSIS — N6320 Unspecified lump in the left breast, unspecified quadrant: Secondary | ICD-10-CM | POA: Diagnosis not present

## 2021-06-10 DIAGNOSIS — Z1501 Genetic susceptibility to malignant neoplasm of breast: Secondary | ICD-10-CM | POA: Diagnosis not present

## 2021-06-10 DIAGNOSIS — Z79899 Other long term (current) drug therapy: Secondary | ICD-10-CM | POA: Diagnosis not present

## 2021-06-10 DIAGNOSIS — Z9049 Acquired absence of other specified parts of digestive tract: Secondary | ICD-10-CM | POA: Insufficient documentation

## 2021-06-10 DIAGNOSIS — Z803 Family history of malignant neoplasm of breast: Secondary | ICD-10-CM | POA: Diagnosis not present

## 2021-06-10 DIAGNOSIS — Z90722 Acquired absence of ovaries, bilateral: Secondary | ICD-10-CM | POA: Diagnosis not present

## 2021-06-10 DIAGNOSIS — Z818 Family history of other mental and behavioral disorders: Secondary | ICD-10-CM | POA: Insufficient documentation

## 2021-06-10 DIAGNOSIS — Z882 Allergy status to sulfonamides status: Secondary | ICD-10-CM | POA: Insufficient documentation

## 2021-06-10 DIAGNOSIS — Z79811 Long term (current) use of aromatase inhibitors: Secondary | ICD-10-CM | POA: Diagnosis not present

## 2021-06-10 DIAGNOSIS — Z1509 Genetic susceptibility to other malignant neoplasm: Secondary | ICD-10-CM | POA: Insufficient documentation

## 2021-06-10 DIAGNOSIS — Z885 Allergy status to narcotic agent status: Secondary | ICD-10-CM | POA: Diagnosis not present

## 2021-06-10 DIAGNOSIS — Z801 Family history of malignant neoplasm of trachea, bronchus and lung: Secondary | ICD-10-CM | POA: Diagnosis not present

## 2021-06-10 DIAGNOSIS — Z800 Family history of malignant neoplasm of digestive organs: Secondary | ICD-10-CM | POA: Diagnosis not present

## 2021-06-10 DIAGNOSIS — C50811 Malignant neoplasm of overlapping sites of right female breast: Secondary | ICD-10-CM | POA: Diagnosis not present

## 2021-06-17 ENCOUNTER — Ambulatory Visit: Payer: PPO | Admitting: Oncology

## 2021-06-17 ENCOUNTER — Other Ambulatory Visit: Payer: PPO

## 2021-06-26 DIAGNOSIS — Z780 Asymptomatic menopausal state: Secondary | ICD-10-CM | POA: Diagnosis not present

## 2021-06-26 DIAGNOSIS — M8589 Other specified disorders of bone density and structure, multiple sites: Secondary | ICD-10-CM | POA: Diagnosis not present

## 2021-06-26 DIAGNOSIS — M47816 Spondylosis without myelopathy or radiculopathy, lumbar region: Secondary | ICD-10-CM | POA: Diagnosis not present

## 2021-06-26 DIAGNOSIS — Z853 Personal history of malignant neoplasm of breast: Secondary | ICD-10-CM | POA: Diagnosis not present

## 2021-07-11 ENCOUNTER — Ambulatory Visit: Payer: PPO | Attending: Oncology | Admitting: Physical Therapy

## 2021-07-11 ENCOUNTER — Other Ambulatory Visit: Payer: Self-pay

## 2021-07-11 ENCOUNTER — Encounter: Payer: Self-pay | Admitting: Physical Therapy

## 2021-07-11 DIAGNOSIS — Z170 Estrogen receptor positive status [ER+]: Secondary | ICD-10-CM | POA: Insufficient documentation

## 2021-07-11 DIAGNOSIS — M6281 Muscle weakness (generalized): Secondary | ICD-10-CM | POA: Diagnosis present

## 2021-07-11 DIAGNOSIS — C50811 Malignant neoplasm of overlapping sites of right female breast: Secondary | ICD-10-CM | POA: Diagnosis not present

## 2021-07-11 DIAGNOSIS — R279 Unspecified lack of coordination: Secondary | ICD-10-CM | POA: Insufficient documentation

## 2021-07-11 NOTE — Patient Instructions (Addendum)
Moisturizers They are used in the vagina to hydrate the mucous membrane that make up the vaginal canal. Designed to keep a more normal acid balance (ph) Once placed in the vagina, it will last between two to three days.  Use 2-3 times per week at bedtime  Ingredients to avoid is glycerin and fragrance, can increase chance of infection Should not be used just before sex due to causing irritation Most are gels administered either in a tampon-shaped applicator or as a vaginal suppository. They are non-hormonal.   Types of Moisturizers(internal use)  Vitamin E vaginal suppositories- Whole foods, Amazon Moist Again Coconut oil- can break down condoms Julva- (Do no use if on Tamoxifen) amazon Yes moisturizer- amazon NeuEve Silk , NeuEve Silver for menopausal or over 65 (if have severe vaginal atrophy or cancer treatments use NeuEve Silk for  1 month than move to [employer])- [employer], [place].com Olive and Bee intimate cream- www.oliveandbee.com.au Mae vaginal moisturizer- Amazon Aloe    Creams to use externally on the Vulva area Albertson's (good for for cancer patients that had radiation to the area)- [place] or [employer].FlyingBasics.com.br V-magic cream - amazon Julva-amazon Vital "V Wild Yam salve ( help moisturize and help with thinning vulvar area, does have Rudyard by Irwin Brakeman labial moisturizer (Amazon,  Coconut or olive oil aloe   Things to avoid in the vaginal area Do not use things to irritate the vulvar area No lotions just specialized creams for the vulva area- Neogyn, V-magic, No soaps; can use Aveeno or Calendula cleanser if needed. Must be gentle No deodorants No douches Good to sleep without underwear to let the vaginal area to air out No scrubbing: spread the lips to let warm water rinse over labias and pat dry    Access Code: [email or web address] URL: [email or web address] Date:  07/11/2021 Prepared by: Jari Favre  Exercises Supine Lower Trunk Rotation - 1 x daily - 7 x weekly - 1 sets - 10 reps - 5 sec hold Supine Sciatic Nerve Glide - 1 x daily - 7 x weekly - 3 sets - 10 reps Supine Figure 4 Piriformis Stretch - 1 x daily - 7 x weekly - 1 sets - 3 reps - 30 sec hold

## 2021-07-11 NOTE — Therapy (Signed)
[facility] @ [place] [place], [place], [address] Phone: [phone]   Fax:  [phone]  Physical Therapy Evaluation  Patient Details  Name: Diane Macias MRN: [id number] Date of Birth: Apr 06, 1946 Referring Provider (PT): Magrinat, Virgie Dad, MD   Encounter Date: 07/11/2021   PT End of Session - 07/11/21 1201     Visit Number 1    Date for PT Re-Evaluation 10/03/21    PT Start Time 8341    PT Stop Time 1225    PT Time Calculation (min) 40 min    Activity Tolerance Patient tolerated treatment well    Behavior During Therapy [facility] for tasks assessed/performed             Past Medical History:  Diagnosis Date   Adenomatous colon polyp 04/18/2011   Allergy    Arthritis    Cancer ([place]) 1997   squamous and basal cell   Diabetes mellitus    type II   Hyperlipidemia    Hypertension    Osteopenia    Sleep apnea    CPAP    Past Surgical History:  Procedure Laterality Date   arthroscopic knee  01/2008   right knee   CATARACT EXTRACTION Left 2016   CHOLECYSTECTOMY  1981   COLONOSCOPY     FOOT SURGERY  07/2007   right foot (BUNION)   KNEE ARTHROSCOPY     left   TONSILLECTOMY  1953   torn maniscus  11/2010   left knee   VAGINAL HYSTERECTOMY  1994    There were no vitals filed for this visit.    Subjective Assessment - 07/11/21 1152     Subjective Pt reports she is having urgency, leakage, and vaginal dryness.  Pt is having a strong urge.  I have constipation.  I sometimes I don't empty all the time.  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  Pt is drinking more water and is going at least every 2 hours unless I am going somewhere. I drink ice tea and not drinking it for a few days may have helped.                [facility] PT Assessment - 07/11/21 0001       Assessment   Medical Diagnosis C50.811,Z17.0 (ICD-10-CM) - Malignant neoplasm of overlapping sites of right breast in female, estrogen receptor  positive [facility])    Referring Provider (PT) Magrinat, Virgie Dad, MD      Precautions   Precautions None      Balance Screen   Has the patient fallen in the past 6 months Yes    How many times? 2    Has the patient had a decrease in activity level because of a fear of falling?  No    Is the patient reluctant to leave their home because of a fear of falling?  No      Home Ecologist residence    Living Arrangements Spouse/significant other      Prior Function   Level of [place] Retired    Leisure walking      Cognition   Overall Cognitive Status Within Functional Limits for tasks assessed      Functional Tests   Functional tests Single leg stance      Single Leg Stance   Comments trendelenburg left side      Posture/Postural Control   Posture/Postural Control Postural limitations  Postural Limitations Flexed trunk;Anterior pelvic tilt      ROM / Strength   AROM / PROM / Strength AROM;PROM;Strength      AROM   Overall AROM Comments lumbar 60%      PROM   Overall PROM Comments Rt hip ER and flexion 60%; Lt 80%      Strength   Overall Strength Comments Lt hip 4/5 abduction      Flexibility   Soft Tissue Assessment /Muscle Length yes    Hamstrings 70%      Ambulation/Gait   Gait Pattern Within Functional Limits                        Objective measurements completed on examination: See above findings.     Pelvic Floor Special Questions - 07/11/21 0001     Prior Pregnancies Yes    Urinary Leakage Yes    Fecal incontinence --   BM straining   External Palpation outside clothing - 11 quick flicks; kegel and holding breath    Exam Type Deferred              [facility] Adult PT Treatment/Exercise - 07/11/21 0001       Self-Care   Self-Care Other Self-Care Comments    Other Self-Care Comments  initial HEP as seen                     PT Education - 07/11/21 1356      Education Details Access Code: [email or web address]    Person(s) Educated Patient    Methods Explanation;Demonstration;Tactile cues;Verbal cues;Handout    Comprehension Returned demonstration;Verbalized understanding                 PT Long Term Goals - 07/11/21 1343       PT LONG TERM GOAL #1   Title Pt will report she is having 50% less urgency    Time 12    Period Weeks    Status New    Target Date 10/03/21      PT LONG TERM GOAL #2   Title Pt will report 75% improved vaginal dryness and less disomfort    Time 12    Period Weeks    Status New    Target Date 10/03/21      PT LONG TERM GOAL #3   Title Pt report she is able to have intercourse with 1/3 [place] at most    Baseline 3/3    Time 12    Period Weeks    Status New    Target Date 10/03/21      PT LONG TERM GOAL #4   Title Pt will have leakage managed so that she is only changing underwear 1/day at most    Baseline 2-4x/day    Time 12    Period Weeks    Status New    Target Date 10/03/21                    Plan - 07/11/21 1347     Clinical Impression Statement Pt presents to skilled PT due to vaginal dryness, urgency and leakage.  Pt also has constipation.  She is concerned about overall pelvic health and getting rid of leakage that has been worsening.  Pt has Lt hip weakness and bil h/s tight, bil limited hip ROM.  She is holding her breath when engageing the pelvic floor and core.  Pt was educated on initial stretches  and how to use coconut oil to moisturize . pt will benefit from skilled PT to address strenght, coordination and all impairments mentioned above to restore to maximum function and quality of life.    Personal Factors and Comorbidities Comorbidity 1;Time since onset of injury/illness/exacerbation;Comorbidity 2    Comorbidities breast cancer, hysterectomy    Examination-Activity Limitations Continence;Toileting    Examination-Participation Restrictions Community Activity;Interpersonal  Relationship    Stability/Clinical Decision Making Evolving/Moderate complexity    Clinical Decision Making Moderate    Rehab Potential Excellent    PT Frequency 1x / week    PT Duration 12 weeks    PT Treatment/Interventions ADLs/Self Care Home Management;Biofeedback;Cryotherapy;Electrical Stimulation;Moist Heat;Therapeutic activities;Therapeutic exercise;Neuromuscular re-education;Patient/family education;Manual techniques;Dry needling;Taping;Passive range of motion    PT Next Visit Plan toileting techniques, f/u on stretches, urge drills, hip mobility and strength    PT Home Exercise Plan Access Code: [email or web address]    Consulted and Agree with Plan of Care Patient             Patient will benefit from skilled therapeutic intervention in order to improve the following deficits and impairments:  Pain, Increased fascial restricitons, Decreased strength, Decreased endurance, Decreased coordination, Increased muscle spasms, Impaired flexibility  Visit Diagnosis: Muscle weakness (generalized)  Unspecified lack of coordination     Problem List Patient Active Problem List   Diagnosis Date Noted   Malignant neoplasm of overlapping sites of right breast in female, estrogen receptor positive ([place]) 03/17/2017   Diabetes mellitus type II, controlled ([place]) 11/05/2016   Diverticulosis 11/05/2016   Essential hypertension 11/05/2016   Fatty liver 11/05/2016   Hyperlipemia 11/05/2016   Obesity (BMI 30-39.9) 11/05/2016   OSA on CPAP 11/05/2016   Initial Medicare annual wellness visit 03/26/2016   Referred otalgia of right ear 09/03/2015   Sensorineural hearing loss 09/03/2015   Incomplete tear of right rotator cuff 12/09/2014   Impingement syndrome of right shoulder 11/30/2014   Tendinitis of right rotator cuff 11/30/2014   Lower urinary tract infectious disease 03/09/2014   Mixed incontinence 03/09/2014   CARPAL TUNNEL SYNDROME 01/02/2009   INTERNAL HEMORRHOIDS 01/02/2009   DIVERTICULOSIS,  COLON 01/02/2009   FATTY LIVER DISEASE 01/02/2009   ENDOMETRIOSIS 01/02/2009   OSTEOPOROSIS 01/02/2009   SKIN CANCER, HX OF 01/02/2009   COLONIC POLYPS, ADENOMATOUS, HX OF 01/02/2009   DIVERTICULITIS, HX OF 01/02/2009    Jule Ser, PT 07/11/2021, 1:59 PM  [facility] @ [place], [place], [address] Phone: [phone]   Fax:  [phone]  Name: Diane Macias MRN: [id number] Date of Birth: 17-Aug-1945

## 2021-09-09 ENCOUNTER — Inpatient Hospital Stay: Payer: PPO | Admitting: Hematology and Oncology

## 2021-09-13 ENCOUNTER — Other Ambulatory Visit: Payer: Self-pay

## 2021-09-13 ENCOUNTER — Inpatient Hospital Stay: Payer: PPO | Attending: Hematology and Oncology | Admitting: Hematology and Oncology

## 2021-09-13 VITALS — BP 148/71 | HR 77 | Temp 97.7°F | Resp 16 | Ht 62.0 in | Wt 179.6 lb

## 2021-09-13 DIAGNOSIS — M85859 Other specified disorders of bone density and structure, unspecified thigh: Secondary | ICD-10-CM

## 2021-09-13 DIAGNOSIS — Z9049 Acquired absence of other specified parts of digestive tract: Secondary | ICD-10-CM | POA: Diagnosis not present

## 2021-09-13 DIAGNOSIS — N951 Menopausal and female climacteric states: Secondary | ICD-10-CM

## 2021-09-13 DIAGNOSIS — G4700 Insomnia, unspecified: Secondary | ICD-10-CM | POA: Diagnosis not present

## 2021-09-13 DIAGNOSIS — Z811 Family history of alcohol abuse and dependence: Secondary | ICD-10-CM | POA: Insufficient documentation

## 2021-09-13 DIAGNOSIS — Z882 Allergy status to sulfonamides status: Secondary | ICD-10-CM | POA: Diagnosis not present

## 2021-09-13 DIAGNOSIS — Z8679 Personal history of other diseases of the circulatory system: Secondary | ICD-10-CM | POA: Insufficient documentation

## 2021-09-13 DIAGNOSIS — Z170 Estrogen receptor positive status [ER+]: Secondary | ICD-10-CM | POA: Diagnosis not present

## 2021-09-13 DIAGNOSIS — N6320 Unspecified lump in the left breast, unspecified quadrant: Secondary | ICD-10-CM | POA: Diagnosis not present

## 2021-09-13 DIAGNOSIS — Z79811 Long term (current) use of aromatase inhibitors: Secondary | ICD-10-CM | POA: Diagnosis not present

## 2021-09-13 DIAGNOSIS — Z881 Allergy status to other antibiotic agents status: Secondary | ICD-10-CM | POA: Diagnosis not present

## 2021-09-13 DIAGNOSIS — Z801 Family history of malignant neoplasm of trachea, bronchus and lung: Secondary | ICD-10-CM | POA: Insufficient documentation

## 2021-09-13 DIAGNOSIS — Z923 Personal history of irradiation: Secondary | ICD-10-CM | POA: Insufficient documentation

## 2021-09-13 DIAGNOSIS — Z90722 Acquired absence of ovaries, bilateral: Secondary | ICD-10-CM | POA: Insufficient documentation

## 2021-09-13 DIAGNOSIS — Z803 Family history of malignant neoplasm of breast: Secondary | ICD-10-CM | POA: Insufficient documentation

## 2021-09-13 DIAGNOSIS — C50811 Malignant neoplasm of overlapping sites of right female breast: Secondary | ICD-10-CM | POA: Diagnosis not present

## 2021-09-13 DIAGNOSIS — M8580 Other specified disorders of bone density and structure, unspecified site: Secondary | ICD-10-CM | POA: Diagnosis not present

## 2021-09-13 DIAGNOSIS — Z8639 Personal history of other endocrine, nutritional and metabolic disease: Secondary | ICD-10-CM | POA: Insufficient documentation

## 2021-09-13 DIAGNOSIS — Z79899 Other long term (current) drug therapy: Secondary | ICD-10-CM | POA: Diagnosis not present

## 2021-09-13 DIAGNOSIS — Z885 Allergy status to narcotic agent status: Secondary | ICD-10-CM | POA: Diagnosis not present

## 2021-09-13 DIAGNOSIS — C50111 Malignant neoplasm of central portion of right female breast: Secondary | ICD-10-CM | POA: Diagnosis present

## 2021-09-13 DIAGNOSIS — Z818 Family history of other mental and behavioral disorders: Secondary | ICD-10-CM | POA: Diagnosis not present

## 2021-09-13 DIAGNOSIS — Z87891 Personal history of nicotine dependence: Secondary | ICD-10-CM | POA: Diagnosis not present

## 2021-09-13 MED ORDER — ESTRADIOL 0.1 MG/GM VA CREA: 1.0000 | TOPICAL_CREAM | VAGINAL | 12 refills | Status: DC

## 2021-09-13 NOTE — Progress Notes (Signed)
[facility]  [phone] [phone]     ID: Diane Macias DOB: 1946-03-22  MR#: [id number]  RZW#:[id number]  Patient Care Team: Cheron Schaumann., MD as PCP - General (Internal Medicine) Lovena Neighbours, MD as Referring Physician (Surgery) Magrinat, Valentino Hue, MD (Inactive) as Consulting Physician (Oncology) Lurline Hare, MD as Referring Physician (Radiation Oncology) Napoleon Form, MD as Consulting Physician (Gastroenterology) OTHER MD:  CHIEF COMPLAINT: Estrogen receptor positive lobular breast cancer   CURRENT TREATMENT: [Surveillance.  INTERVAL HISTORY: Diane Macias returns today for follow up of her estrogen receptor positive lobular breast cancer.  Since her last visit, she has stopped exemestane but has not noticed a lot of difference in her vaginal dryness.  She states it is tolerable but she might want to try the vaginal estrogen cream.  She is a bit worried about putting estrogen in her body.  She has also had some hair thinning with exemestane and insomnia.  She has difficulty falling asleep when she wakes up in the middle of the night to pee.  She had her last mammogram in October 2022 which was negative for malignancy.  She also had a bone density which showed osteopenia.  Rest of the pertinent 10 point ROS reviewed and negative.  REVIEW OF SYSTEMS:   A detailed review of systems today is benign except as noted above   COVID 19 VACCINATION STATUS: Status post Pfizer x2+ booster September 2021   HISTORY OF CURRENT ILLNESS: From the original intake note:  Diane Macias had bilateral screening mammography at [facility] 11/30/2014 showing the breast density to be category B. There was a possible mass in the right breast. On 12/10/2016 she underwent right diagnostic mammography with tomography and right breast ultrasonography. This confirmed a 0.4 cm spiculated mass in the periareolar area of the right breast. Ultrasound showed a small  irregular hypoechoic mass at the 9:00 position 1 cm from the nipple, measuring 0.5 cm. Ultrasound of the right axilla was benign.  Biopsy of the right breast area in question 12/25/2016 showed an invasive lobular carcinoma, grade 2, estrogen receptor positive at 85%, progesterone receptor positive at 65%, both with strong staining intensity, and HER-2 not amplified by immunohistochemistry (1+).   On 01/19/2017 she underwent bilateral breast MRIs which showed in addition to the known right breast mass an additional area of concern in the central right breast. This measured 0.6 cm. MRI guided biopsy 02/06/2017 confirmed that this second area also was invasive lobular carcinoma, grade 2. It was located (by MRI) 1.5 cm inferior medial to the previously biopsied mass. Also there was a 0.6 mm sternal lesion in the inferior aspect of the sternum.  Because of the sternal lesion on the patient underwent PET scanning 02/12/2017. This was entirely negative and the sternal lesion is felt to be most consistent with a bone island  On 02/20/2017 the patient underwent right lumpectomy. This showed ([id number]) invasive lobular carcinoma measuring 0.5 cm, grade 2, with evidence of lobular carcinoma in situ. Margins were clear. Both sentinel lymph nodes were clear. [NB: the second biopsied lesion was present in the surgical sample but not addressed in the report; I have discussed this with pathology and Dr Hart Carwin has kindly agreed to review the case, measure this lesion and margins, and dictate an addendum]  The patient's subsequent history is as detailed below.   PAST MEDICAL HISTORY: Past Medical History:  Diagnosis Date   Adenomatous colon polyp 04/18/2011   Allergy    Arthritis  Cancer ([facility]) 1997   squamous and basal cell   Diabetes mellitus    type II   Hyperlipidemia    Hypertension    Osteopenia    Sleep apnea    CPAP    PAST SURGICAL HISTORY: Past Surgical History:  Procedure Laterality Date    arthroscopic knee  01/2008   right knee   CATARACT EXTRACTION Left 2016   CHOLECYSTECTOMY  1981   COLONOSCOPY     FOOT SURGERY  07/2007   right foot ([facility])   KNEE ARTHROSCOPY     left   TONSILLECTOMY  1953   torn maniscus  11/2010   left knee   VAGINAL HYSTERECTOMY  1994    FAMILY HISTORY Family History  Problem Relation Age of Onset   Lung cancer Father 82   Colon cancer Neg Hx   The patient's father died from lung cancer in the setting of tobacco abuse. He was 76 years old. The patient's mother died at 74 with Alzheimer's disease. The patient had no brothers, 2 sisters. One sister was diagnosed with breast cancer at age 78. There is also a maternal cousin with breast cancer. There is no history of ovarian or prostate cancer in the family. The patient was genetically tested at [facility] and reportedly carries a heterozygous MutYH mutation. I do have requested that report   GYNECOLOGIC HISTORY:  No LMP recorded. Patient has had a hysterectomy. Menarche age 41, first live birth age 40, the patient is [place] P2. She underwent total abdominal hysterectomy with bilateral salpingo-oophorectomy approximately age 54. She took hormone replacement for approximately 17 years.   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 [name] is a retired Scientist, water quality. Daughter [name] is a Risk manager in [place]. Daughter [name] is Web designer at the country day school locally. The patient has 3 grandchildren. She is a Tourist information centre manager.    ADVANCED DIRECTIVES:    HEALTH MAINTENANCE: Social History   Tobacco Use   Smoking status: Former    Types: Cigarettes    Quit date: 07/21/1968    Years since quitting: 53.1   Smokeless tobacco: Never  Substance Use Topics   Alcohol use: Yes    Comment: rarely wine   Drug use: No     Colonoscopy: May 2018; Nandigam  PAP:  Bone density:   Allergies  Allergen Reactions   Ciprofloxacin Swelling and Rash    other    Flagyl [Metronidazole] Swelling   Celecoxib Swelling   Diazepam Other (See Comments)    dpression   Hydrocodone Other (See Comments)    Could not sleep   Oxycodone Other (See Comments)    Restless   Sulfa Antibiotics Rash   Sulfonamide Derivatives Rash    Current Outpatient Medications  Medication Sig Dispense Refill   estradiol (ESTRACE VAGINAL) 0.1 MG/GM vaginal cream Place 1 Applicatorful vaginally 3 (three) times a week. 42.5 g 12   cholecalciferol (VITAMIN D) 1000 UNITS tablet Take 1,000 Units by mouth daily.       clotrimazole-betamethasone (LOTRISONE) cream Apply to affected area 2 times daily prn 45 g 0   glucose blood test strip 1 each by Other route as needed for other. Use as instructed     lisinopril (PRINIVIL,ZESTRIL) 10 MG tablet Take 10 mg by mouth daily.       metFORMIN (GLUCOPHAGE) 500 MG tablet Take 500 mg by mouth daily after supper.       ONE TOUCH LANCETS MISC  by Does not apply route.     Probiotic Product (ALIGN PO) Take 1 capsule by mouth daily.     rosuvastatin (CRESTOR) 5 MG tablet Take 1 tablet (5 mg total) by mouth daily.     traMADol (ULTRAM) 50 MG tablet 1-2 tablets every 6 hours as needed for pain 20 tablet 0   vitamin B-12 (CYANOCOBALAMIN) 1000 MCG tablet Take 1,000 mcg by mouth daily.     Current Facility-Administered Medications  Medication Dose Route Frequency Provider Last Rate Last Admin   0.9 %  sodium chloride infusion  500 mL Intravenous Continuous Nandigam, Venia Minks, MD        OBJECTIVE: White woman in no acute distress  Vitals:   09/13/21 0930  BP: (!) 148/71  Pulse: 77  Resp: 16  Temp: 97.7 F (36.5 C)  SpO2: 98%       Body mass index is 32.85 kg/m.   Wt Readings from Last 3 Encounters:  09/13/21 179 lb 9.6 oz (81.5 kg)  06/10/21 179 lb 1.6 oz (81.2 kg)  12/29/20 174 lb (78.9 kg)     ECOG FS:1 - Symptomatic but completely ambulatory  Physical Exam Constitutional:      Appearance: Normal appearance.  Cardiovascular:      Pulses: Normal pulses.     Heart sounds: Normal heart sounds.  Pulmonary:     Effort: Pulmonary effort is normal.     Breath sounds: Normal breath sounds.  Chest:     Comments: Bilateral breast exam, no palpable masses or regional adenopathy. Musculoskeletal:     Cervical back: Normal range of motion and neck supple. No rigidity.  Lymphadenopathy:     Cervical: No cervical adenopathy.  Skin:    General: Skin is warm and dry.     Coloration: Skin is not jaundiced or pale.  Neurological:     General: No focal deficit present.     Mental Status: She is alert. Mental status is at baseline.  Psychiatric:        Mood and Affect: Mood normal.      LAB RESULTS:  CMP     Component Value Date/Time   NA 140 03/01/2018 1026   NA 139 03/17/2017 1524   K 4.8 03/01/2018 1026   K 4.2 03/17/2017 1524   CL 108 03/01/2018 1026   CO2 23 03/01/2018 1026   CO2 23 03/17/2017 1524   GLUCOSE 101 (H) 03/01/2018 1026   GLUCOSE 102 03/17/2017 1524   BUN 27 (H) 03/01/2018 1026   BUN 28.6 (H) 03/17/2017 1524   CREATININE 1.01 (H) 03/01/2018 1026   CREATININE 0.9 03/17/2017 1524   CALCIUM 9.7 03/01/2018 1026   CALCIUM 9.8 03/17/2017 1524   PROT 7.0 03/01/2018 1026   PROT 6.8 03/17/2017 1524   ALBUMIN 3.6 03/01/2018 1026   ALBUMIN 3.8 03/17/2017 1524   AST 16 03/01/2018 1026   AST 15 03/17/2017 1524   ALT 17 03/01/2018 1026   ALT 16 03/17/2017 1524   ALKPHOS 90 03/01/2018 1026   ALKPHOS 88 03/17/2017 1524   BILITOT 0.4 03/01/2018 1026   BILITOT 0.31 03/17/2017 1524   GFRNONAA 54 (L) 03/01/2018 1026   GFRAA >60 03/01/2018 1026    Lab Results  Component Value Date   WBC 7.9 03/01/2018   NEUTROABS 4.5 03/01/2018   HGB 12.2 03/01/2018   HCT 37.8 03/01/2018   MCV 94.7 03/01/2018   PLT 176 03/01/2018      Chemistry      Component Value Date/Time  NA 140 03/01/2018 1026   NA 139 03/17/2017 1524   K 4.8 03/01/2018 1026   K 4.2 03/17/2017 1524   CL 108 03/01/2018 1026   CO2  23 03/01/2018 1026   CO2 23 03/17/2017 1524   BUN 27 (H) 03/01/2018 1026   BUN 28.6 (H) 03/17/2017 1524   CREATININE 1.01 (H) 03/01/2018 1026   CREATININE 0.9 03/17/2017 1524      Component Value Date/Time   CALCIUM 9.7 03/01/2018 1026   CALCIUM 9.8 03/17/2017 1524   ALKPHOS 90 03/01/2018 1026   ALKPHOS 88 03/17/2017 1524   AST 16 03/01/2018 1026   AST 15 03/17/2017 1524   ALT 17 03/01/2018 1026   ALT 16 03/17/2017 1524   BILITOT 0.4 03/01/2018 1026   BILITOT 0.31 03/17/2017 1524       No results found for: LABCA2  No components found for: [id number]  No results for input(s): INR in the last 168 hours.  No results found for: LABCA2  No results found for: ZCH885  No results found for: OYD741  No results found for: OIN867  No results found for: CA2729  No components found for: HGQUANT  No results found for: CEA1 / No results found for: CEA1   No results found for: AFPTUMOR  No results found for: CHROMOGRNA  No results found for: TOTALPROTELP, ALBUMINELP, A1GS, A2GS, BETS, BETA2SER, GAMS, MSPIKE, SPEI (this displays SPEP labs)  No results found for: KPAFRELGTCHN, LAMBDASER, KAPLAMBRATIO (kappa/lambda light chains)  No results found for: HGBA, HGBA2QUANT, HGBFQUANT, HGBSQUAN (Hemoglobinopathy evaluation)   No results found for: LDH  No results found for: IRON, TIBC, IRONPCTSAT (Iron and TIBC)  No results found for: FERRITIN  Urinalysis No results found for: COLORURINE, APPEARANCEUR, LABSPEC, PHURINE, GLUCOSEU, HGBUR, BILIRUBINUR, KETONESUR, PROTEINUR, UROBILINOGEN, NITRITE, LEUKOCYTESUR   STUDIES: No results found.   ELIGIBLE FOR AVAILABLE RESEARCH PROTOCOL: no  ASSESSMENT: 76 y.o. [place], [place] woman status post right lumpectomy 02/20/2017 for 2 separate areas of invasive lobular carcinoma, grade 2, only one addressed in the pathology report, that one being pT1a pN0, stage IA, estrogen and progesterone receptor strongly positive, HER-2 not  amplified  (a) the second area of known invasive lobular carcinoma was also included in the surgical sample; its dimensions and margins were to be addressed in a pathology addendum  (1) Oncotype not indicated and no chemotherapy is warranted  (2) adjuvant radiation to the right breast completed 05/25/2017  (3) exemestane started 07/27/2017, switched to exemestane April 2019  (a) bone density at [facility] 11/27/2015 shows a T score of +0.4  (b) repeat bone density 04/26/2018 shows a T score of -1.0   (4) per patient, she carries a heterozygous MutYH mutation   PLAN: Since she has discontinued exemestane, she has taken it for about just over 4 years, she has noticed some improvement in vaginal dryness but not significantly. We have discussed about trying vaginal estrogen which has minimal absorption into the systemic circulation since this was recommended by Dr. Jana Hakim.  She is interested in it.  I have sent her a prescription to try it thrice weekly. With regards to insomnia, since she has difficulty falling asleep sleep after she wakes up in the middle of the night, I do not believe she will benefit from any of the sleeping pills.  I believe if she takes it too late in the night, she will have daytime drowsiness which will increase risk of falls.  Hopefully the vaginal estrogen will also help with the  urinary symptoms.  She can try to delay the onset of sleep and pee before she sleeps to see if she can sleep during the peak hours. With regards to bone health, she should continue vitamin D and calcium supplements as tolerated and try some weightbearing exercises.  She plans to go back to the gym soon. She already has a mammogram scheduled for October 2023. She can return to clinic in 6 months.  Total encounter time 40 minutes.*  *Total Encounter Time as defined by the Centers for Medicare and Medicaid Services includes, in addition to the face-to-face time of a patient visit (documented in  the note above) non-face-to-face time: obtaining and reviewing outside history, ordering and reviewing medications, tests or procedures, care coordination (communications with other health care professionals or caregivers) and documentation in the medical record.

## 2021-11-19 IMAGING — CT CT CERVICAL SPINE W/O CM
3 of 4 series · 12 of 33 positions shown, 14 images · non-contrast
Comparison: None.

CLINICAL DATA: Fall

EXAM:
CT CERVICAL SPINE WITHOUT CONTRAST
TECHNIQUE: Multidetector CT imaging of the cervical spine was performed without
intravenous contrast. Multiplanar CT image reconstructions were also
generated.

[Series 3: c_spine 2.0 i30s 3 · axial · 0.34mm/px · z∈[+757,+885]mm · 4 of 97 slices shown, 5 images]
[im 17/97  soft-tissue]
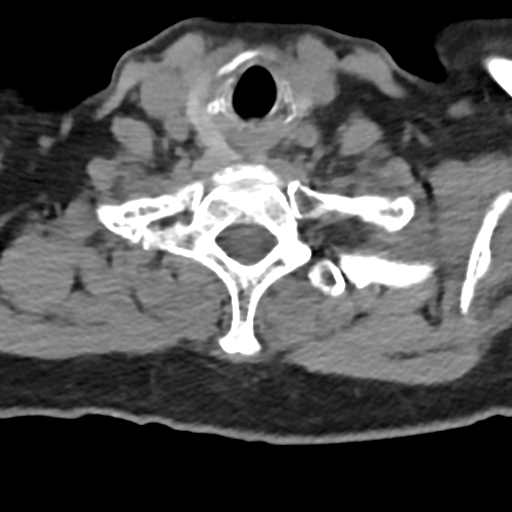
[im 17/97  bone]
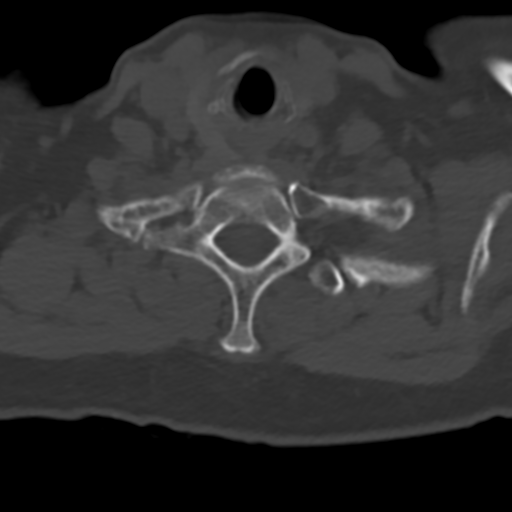
[im 33/97  bone]
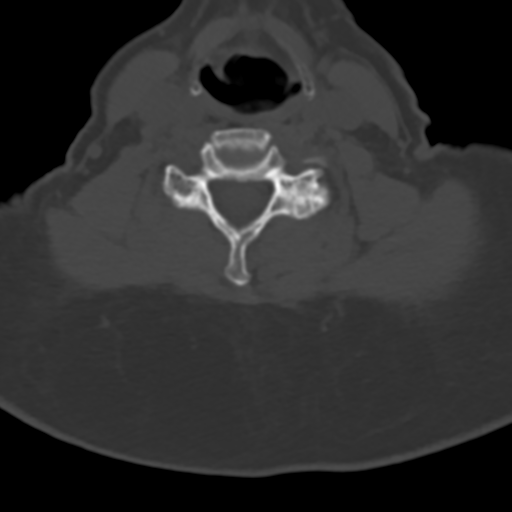
[im 65/97  bone]
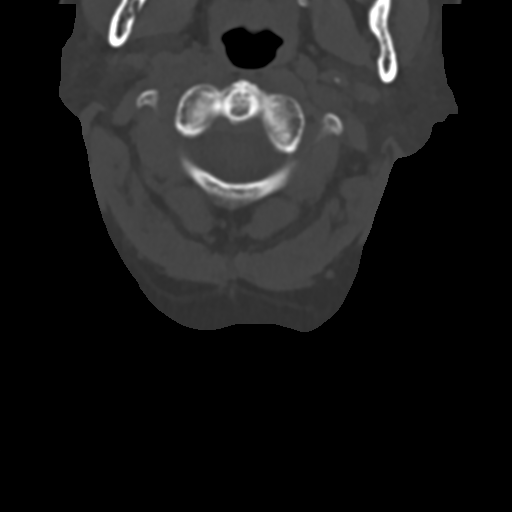
[im 81/97  bone]
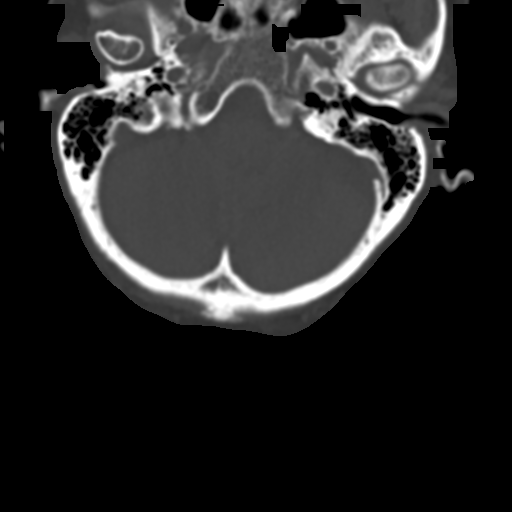

[Series 5: coronals · coronal · 0.28mm/px · 3 of 61 slices shown]
[im 13/61  bone]
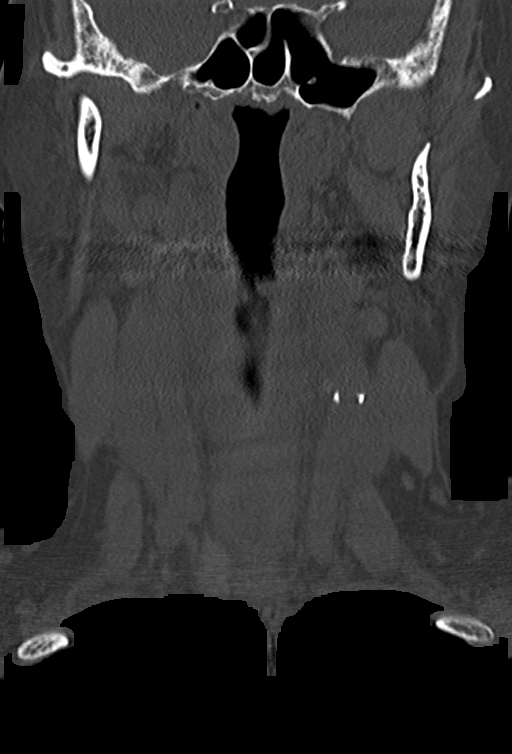
[im 25/61  bone]
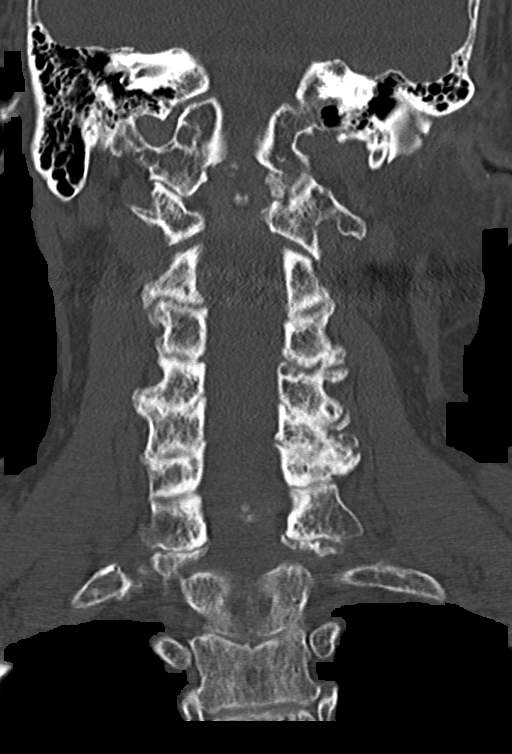
[im 37/61  bone]
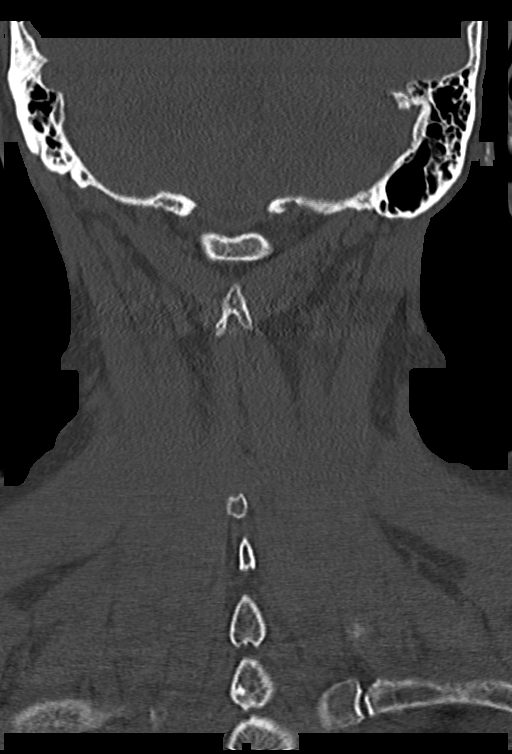

[Series 6: sagittals · sagittal · 0.28mm/px · 5 of 61 slices shown, 6 images]
[im 21/61  bone]
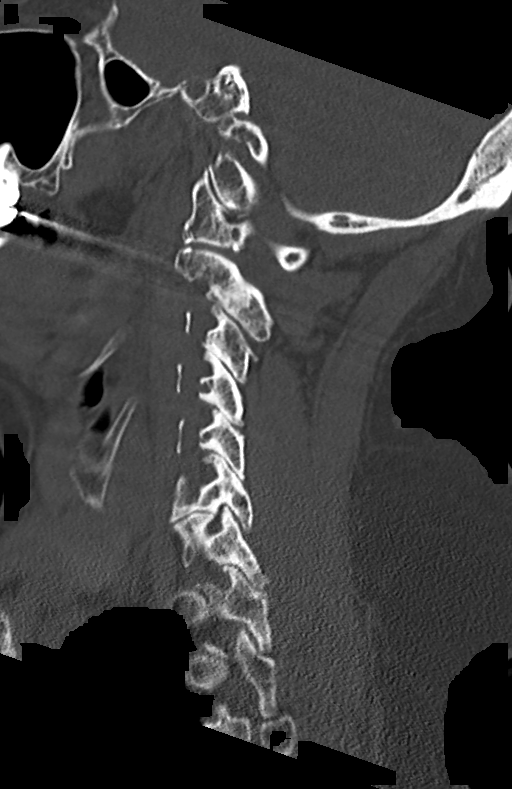
[im 26/61  bone]
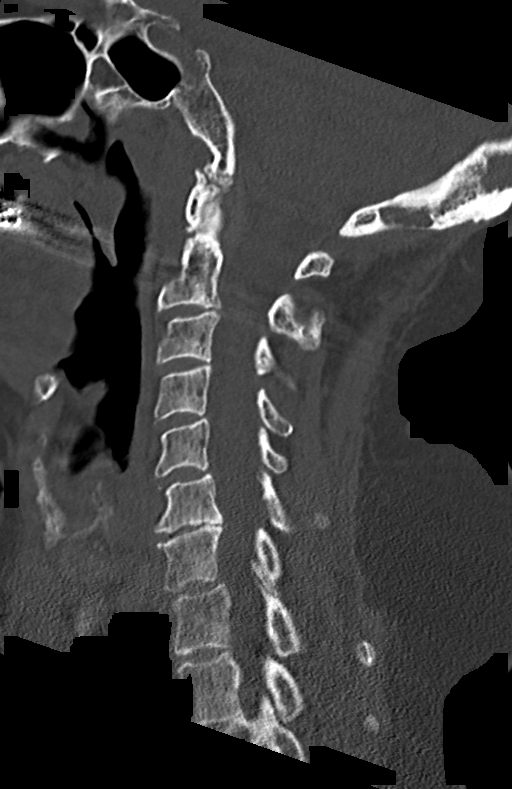
[im 31/61  soft-tissue]
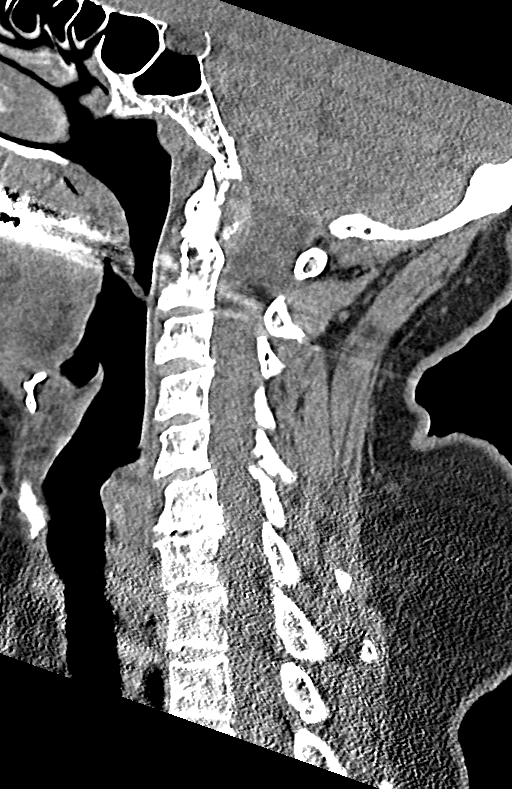
[im 31/61  bone]
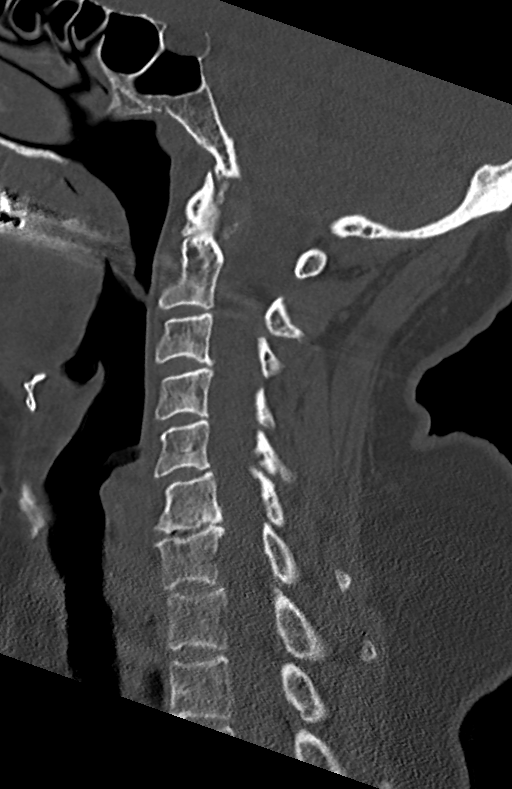
[im 36/61  bone]
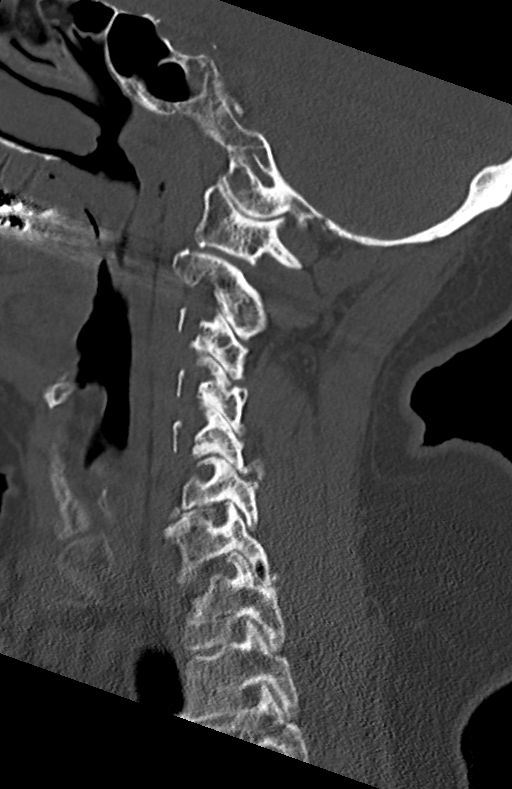
[im 41/61  bone]
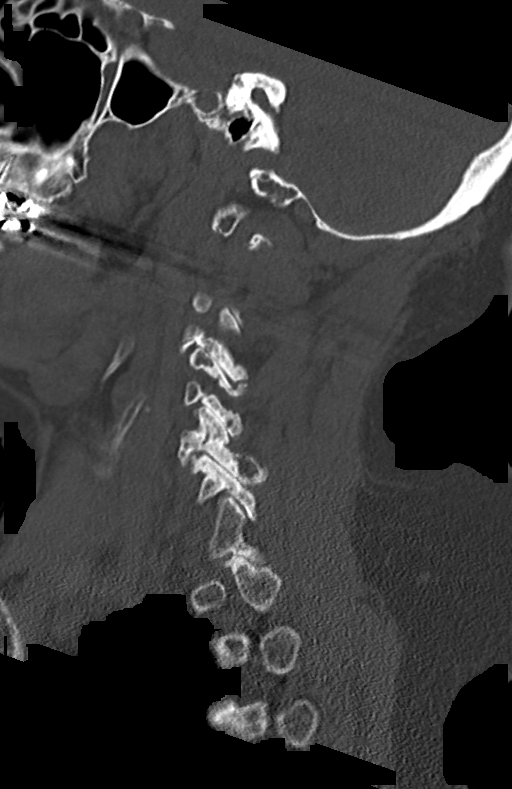

[12 of 33 positions shown; findings below may reference images not displayed]

FINDINGS: Alignment: No subluxation.

Skull base and vertebrae: No acute fracture. No primary bone lesion
or focal pathologic process.

Soft tissues and spinal canal: No prevertebral fluid or swelling. No
visible canal hematoma.

Disc levels: Diffuse degenerative disc disease, most pronounced at
C6-7. Severe diffuse bilateral degenerative facet disease. Moderate
to severe multilevel bilateral neural foraminal narrowing diffusely.

Upper chest: No acute findings

Other: None
IMPRESSION: Degenerative disc and facet disease as above.

No acute bony abnormality.

## 2021-11-19 IMAGING — CT CT HEAD W/O CM
3 series · 16 of 47 positions shown, 19 images · non-contrast
Comparison: None.

CLINICAL DATA: Fall

EXAM:
CT HEAD WITHOUT CONTRAST
TECHNIQUE: Contiguous axial images were obtained from the base of the skull
through the vertex without intravenous contrast.

[Series 2: head 5.0 h30s · axial · 0.39mm/px · z∈[+866,+1006]mm · 10 of 34 slices shown, 13 images]
[im 3/34  brain]
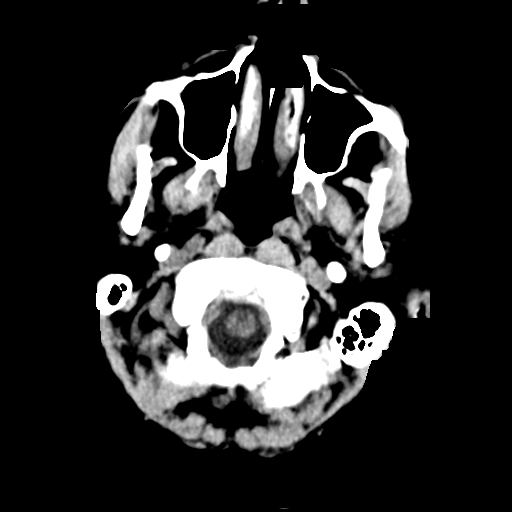
[im 3/34  bone]
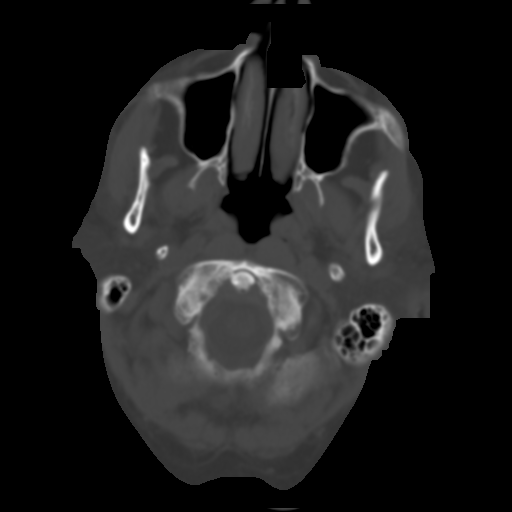
[im 6/34  brain]
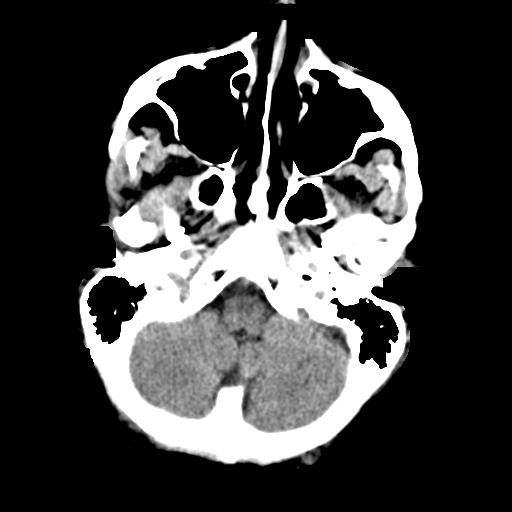
[im 10/34  brain]
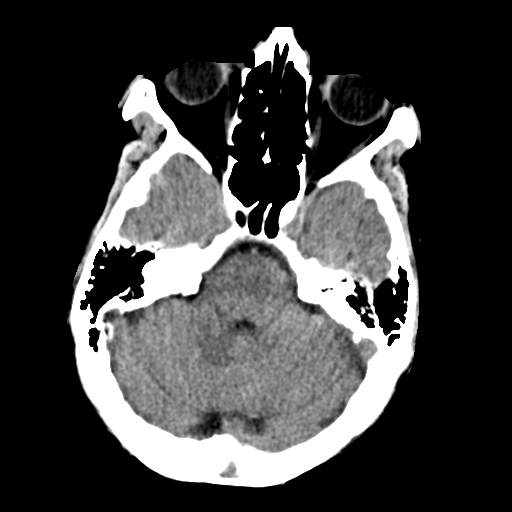
[im 12/34  brain]
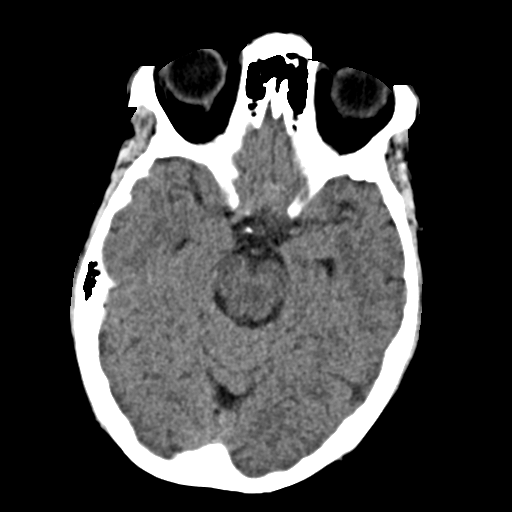
[im 15/34  brain]
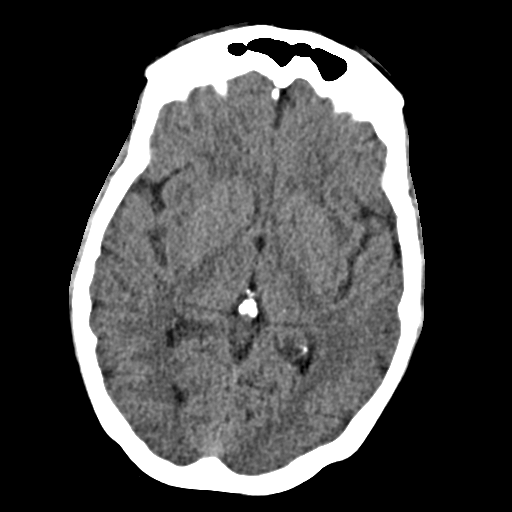
[im 15/34  bone]
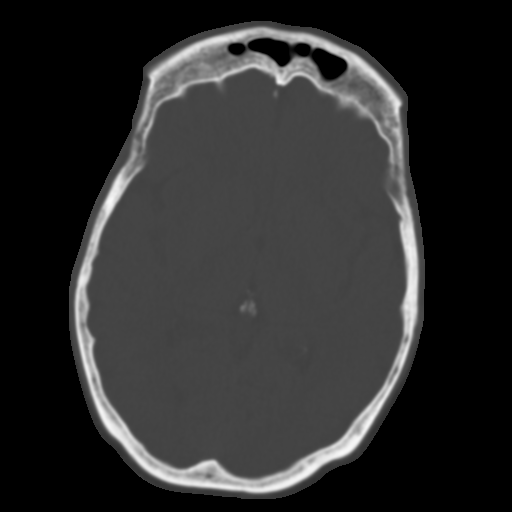
[im 19/34  brain]
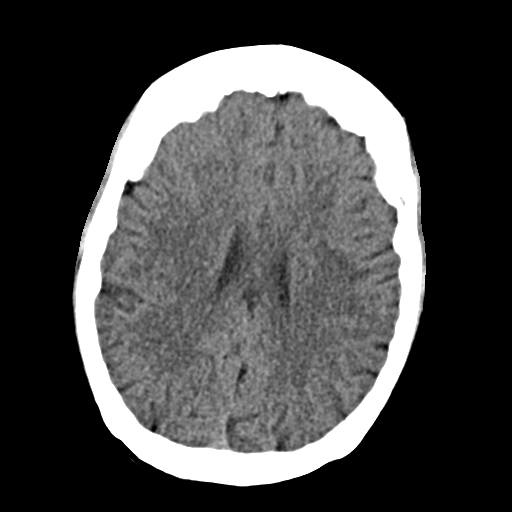
[im 22/34  brain]
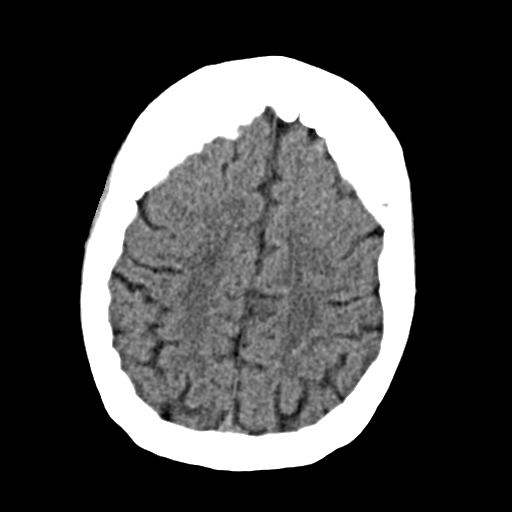
[im 26/34  brain]
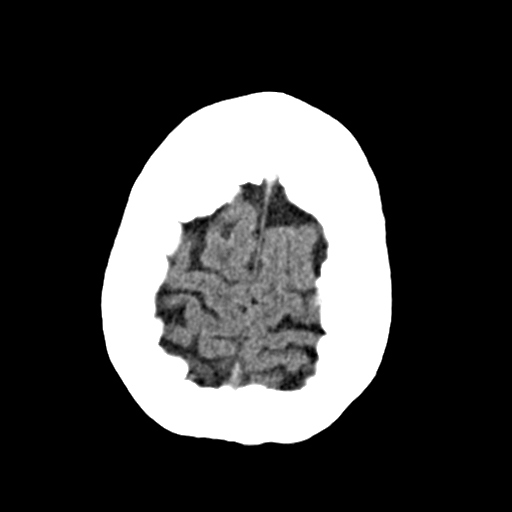
[im 28/34  brain]
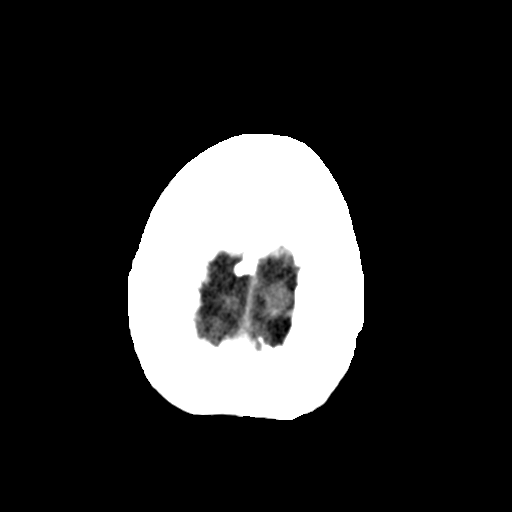
[im 28/34  bone]
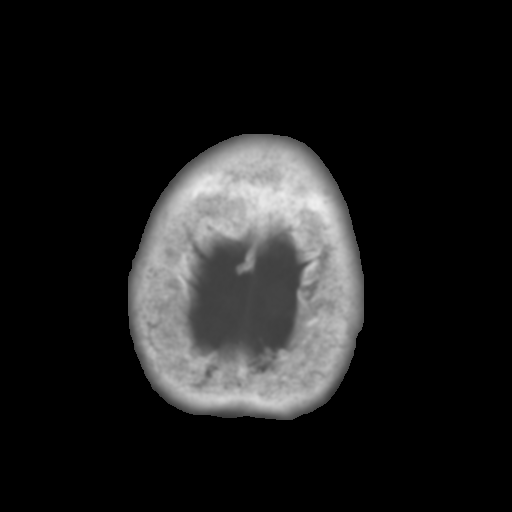
[im 31/34  brain]
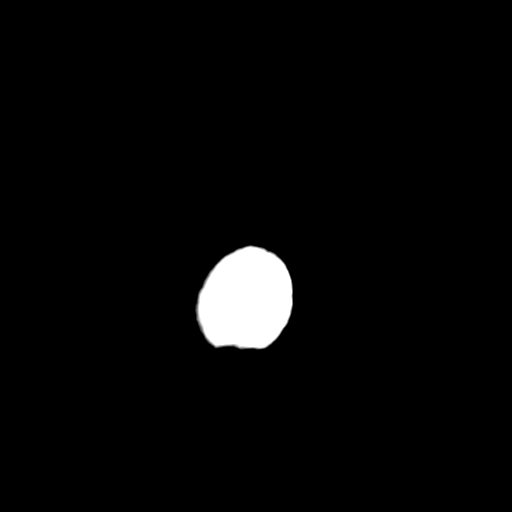

[Series 4: head 3.0 mpr cor · coronal · 0.31mm/px · 3 of 67 slices shown]
[im 23/67  brain]
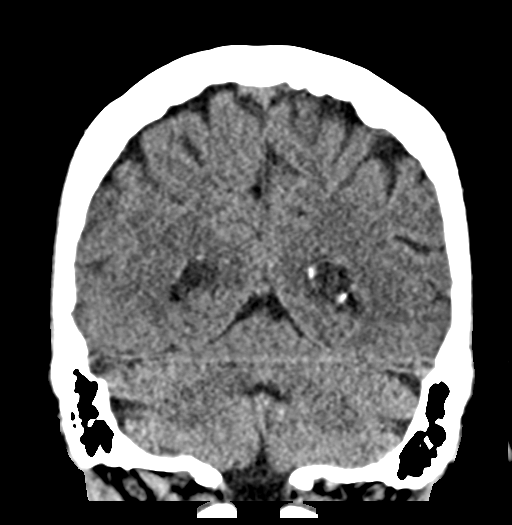
[im 30/67  brain]
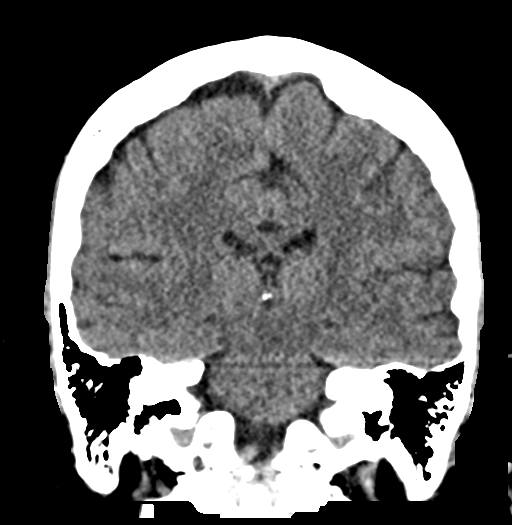
[im 37/67  brain]
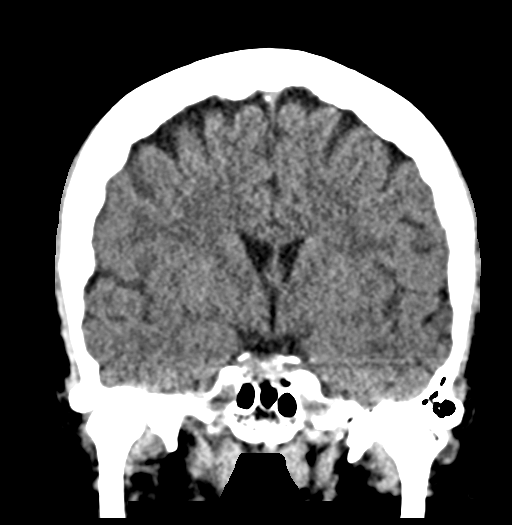

[Series 5: head 3.0 mpr sag · sagittal · 0.32mm/px · 3 of 56 slices shown]
[im 19/56  brain]
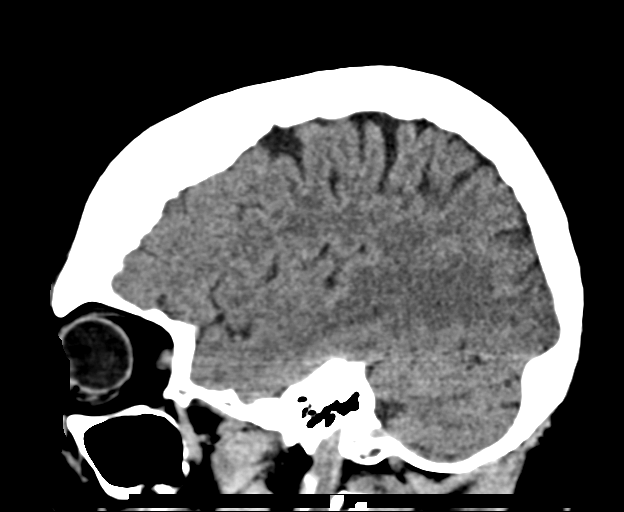
[im 28/56  brain]
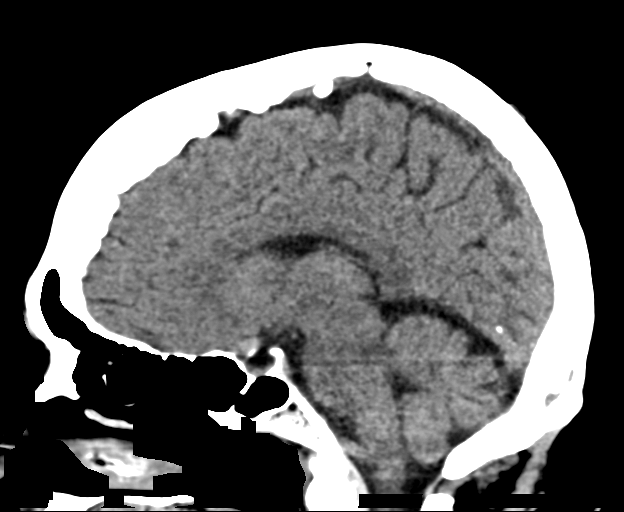
[im 37/56  brain]
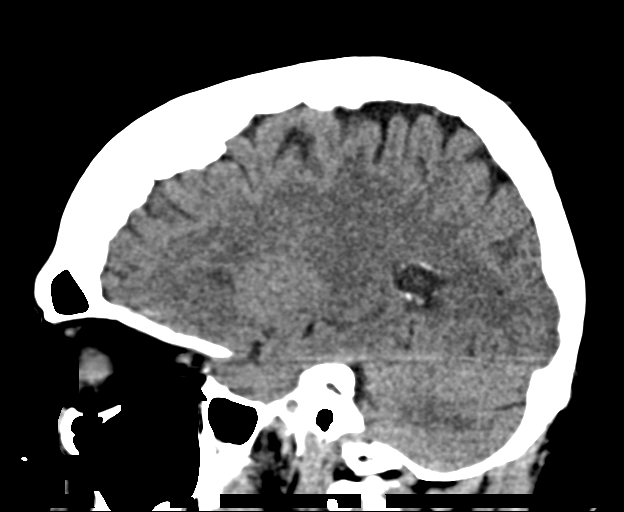

[16 of 47 positions shown; findings below may reference images not displayed]

FINDINGS: Brain: No acute intracranial abnormality. Specifically, no
hemorrhage, hydrocephalus, mass lesion, acute infarction, or
significant intracranial injury.

Vascular: No hyperdense vessel or unexpected calcification.

Skull: No acute calvarial abnormality.

Sinuses/Orbits: No acute findings

Other: None
IMPRESSION: No acute intracranial abnormality.

## 2022-01-11 ENCOUNTER — Encounter: Payer: Self-pay | Admitting: Gastroenterology

## 2022-01-29 ENCOUNTER — Encounter: Payer: Self-pay | Admitting: Gastroenterology

## 2022-03-05 ENCOUNTER — Ambulatory Visit (AMBULATORY_SURGERY_CENTER): Payer: Self-pay

## 2022-03-05 VITALS — Ht 62.0 in | Wt 177.0 lb

## 2022-03-05 DIAGNOSIS — Z86010 Personal history of colonic polyps: Secondary | ICD-10-CM

## 2022-03-05 MED ORDER — NA SULFATE-K SULFATE-MG SULF 17.5-3.13-1.6 GM/177ML PO SOLN: 1.0000 | Freq: Once | ORAL | 0 refills | Status: AC

## 2022-03-05 NOTE — Progress Notes (Signed)
No egg or soy allergy known to patient  No issues known to pt with past sedation with any surgeries or procedures Patient denies ever being told they had issues or difficulty with intubation  No FH of Malignant Hyperthermia Pt is not on diet pills Pt is not on home 02  Pt is not on blood thinners  Pt reports issues with constipation -patient states she takes stools softeners daily and takes a probiotics, increases po intake, patient advised to eat fruits/veggies and increase activity and to take Miralax daily x 5 days prior to prep; No A fib or A flutter Have any cardiac testing pending--NO Pt instructed to use [email or web address] or GoodRx for a price reduction on prep  Patient confirmed insurance to be HTA;  Patient agreed to have RX sent to [facility] in order to get RX at a lower cost with GoodRx discount coupon

## 2022-03-13 ENCOUNTER — Other Ambulatory Visit: Payer: Self-pay

## 2022-03-13 ENCOUNTER — Inpatient Hospital Stay: Payer: PPO | Attending: Hematology and Oncology | Admitting: Hematology and Oncology

## 2022-03-13 ENCOUNTER — Other Ambulatory Visit: Payer: PPO

## 2022-03-13 VITALS — BP 141/74 | HR 63 | Temp 97.5°F | Resp 18 | Ht 62.0 in | Wt 180.3 lb

## 2022-03-13 DIAGNOSIS — Z923 Personal history of irradiation: Secondary | ICD-10-CM | POA: Insufficient documentation

## 2022-03-13 DIAGNOSIS — Z170 Estrogen receptor positive status [ER+]: Secondary | ICD-10-CM | POA: Insufficient documentation

## 2022-03-13 DIAGNOSIS — Z79899 Other long term (current) drug therapy: Secondary | ICD-10-CM | POA: Insufficient documentation

## 2022-03-13 DIAGNOSIS — N6320 Unspecified lump in the left breast, unspecified quadrant: Secondary | ICD-10-CM | POA: Diagnosis not present

## 2022-03-13 DIAGNOSIS — M8580 Other specified disorders of bone density and structure, unspecified site: Secondary | ICD-10-CM | POA: Insufficient documentation

## 2022-03-13 DIAGNOSIS — Z8371 Family history of colonic polyps: Secondary | ICD-10-CM | POA: Diagnosis not present

## 2022-03-13 DIAGNOSIS — C50811 Malignant neoplasm of overlapping sites of right female breast: Secondary | ICD-10-CM | POA: Insufficient documentation

## 2022-03-13 DIAGNOSIS — Z881 Allergy status to other antibiotic agents status: Secondary | ICD-10-CM | POA: Insufficient documentation

## 2022-03-13 DIAGNOSIS — Z801 Family history of malignant neoplasm of trachea, bronchus and lung: Secondary | ICD-10-CM | POA: Diagnosis not present

## 2022-03-13 DIAGNOSIS — E119 Type 2 diabetes mellitus without complications: Secondary | ICD-10-CM | POA: Insufficient documentation

## 2022-03-13 DIAGNOSIS — Z803 Family history of malignant neoplasm of breast: Secondary | ICD-10-CM | POA: Insufficient documentation

## 2022-03-13 DIAGNOSIS — Z882 Allergy status to sulfonamides status: Secondary | ICD-10-CM | POA: Diagnosis not present

## 2022-03-13 DIAGNOSIS — Z885 Allergy status to narcotic agent status: Secondary | ICD-10-CM | POA: Diagnosis not present

## 2022-03-13 DIAGNOSIS — Z90722 Acquired absence of ovaries, bilateral: Secondary | ICD-10-CM | POA: Diagnosis not present

## 2022-03-13 DIAGNOSIS — R3915 Urgency of urination: Secondary | ICD-10-CM | POA: Diagnosis not present

## 2022-03-13 DIAGNOSIS — Z87891 Personal history of nicotine dependence: Secondary | ICD-10-CM | POA: Diagnosis not present

## 2022-03-13 DIAGNOSIS — Z9049 Acquired absence of other specified parts of digestive tract: Secondary | ICD-10-CM | POA: Diagnosis not present

## 2022-03-13 NOTE — Progress Notes (Signed)
[facility]  [phone] [phone]     ID: Diane Macias DOB: [birth year]  MR#: [id number]  WSN#:[id number]  Patient Care Team: Cheron Schaumann., MD as PCP - General (Internal Medicine) Lovena Neighbours, MD as Referring Physician (Surgery) Magrinat, Valentino Hue, MD (Inactive) as Consulting Physician (Oncology) Lurline Hare, MD as Referring Physician (Radiation Oncology) Napoleon Form, MD as Consulting Physician (Gastroenterology) OTHER MD:  CHIEF COMPLAINT: Estrogen receptor positive lobular breast cancer   CURRENT TREATMENT: Surveillance.  INTERVAL HISTORY: [name] returns today for follow up of her estrogen receptor positive lobular breast cancer.  Since her last visit, she has stopped exemestane but has not noticed a lot of difference in her vaginal dryness.   Today her main complaint is urinary urgency to the point of incontinence.  She has to wake up multiple times at night to pee and that she does not get good quality of sleep.  She denies any new health complaints today otherwise.  No new breast changes.  She decided not to take the estrogen vaginal cream. She does her mammograms at [facility], last one in October 2022 which was negative Rest of the pertinent 10 point ROS reviewed and negative  REVIEW OF SYSTEMS:   A detailed review of systems today is benign except as noted above   COVID 19 VACCINATION STATUS: Status post Pfizer x2+ booster September 2021   HISTORY OF CURRENT ILLNESS: From the original intake note:  Diane Macias had bilateral screening mammography at [facility] 11/30/2014 showing the breast density to be category B. There was a possible mass in the right breast. On 12/10/2016 she underwent right diagnostic mammography with tomography and right breast ultrasonography. This confirmed a 0.4 cm spiculated mass in the periareolar area of the right breast. Ultrasound showed a small irregular hypoechoic mass at the 9:00  position 1 cm from the nipple, measuring 0.5 cm. Ultrasound of the right axilla was benign.  Biopsy of the right breast area in question 12/25/2016 showed an invasive lobular carcinoma, grade 2, estrogen receptor positive at 85%, progesterone receptor positive at 65%, both with strong staining intensity, and HER-2 not amplified by immunohistochemistry (1+).   On 01/19/2017 she underwent bilateral breast MRIs which showed in addition to the known right breast mass an additional area of concern in the central right breast. This measured 0.6 cm. MRI guided biopsy 02/06/2017 confirmed that this second area also was invasive lobular carcinoma, grade 2. It was located (by MRI) 1.5 cm inferior medial to the previously biopsied mass. Also there was a 0.6 mm sternal lesion in the inferior aspect of the sternum.  Because of the sternal lesion on the patient underwent PET scanning 02/12/2017. This was entirely negative and the sternal lesion is felt to be most consistent with a bone island  On 02/20/2017 the patient underwent right lumpectomy. This showed ([id number]) invasive lobular carcinoma measuring 0.5 cm, grade 2, with evidence of lobular carcinoma in situ. Margins were clear. Both sentinel lymph nodes were clear. [NB: the second biopsied lesion was present in the surgical sample but not addressed in the report; I have discussed this with pathology and Dr Hart Carwin has kindly agreed to review the case, measure this lesion and margins, and dictate an addendum]  The patient's subsequent history is as detailed below.   PAST MEDICAL HISTORY: Past Medical History:  Diagnosis Date   Adenomatous colon polyp 04/18/2011   Arthritis    bilateral knees/RIGHT shoulder   Breast cancer (HCC) 2018  sx/radiation   Cancer ([place]) 1997   squamous and basal cell   Cataract    bilateral sx   Diabetes mellitus    type II-on meds   Hyperlipidemia    on meds   Hypertension    on meds   Osteopenia    Seasonal  allergies    Sleep apnea    uses CPAP    PAST SURGICAL HISTORY: Past Surgical History:  Procedure Laterality Date   arthroscopic knee Right 01/2008   knee   CATARACT EXTRACTION Bilateral 2016   CHOLECYSTECTOMY  1981   COLONOSCOPY  2018   KN-suprep(good)-tics/hems/TA/SSA   DILATION AND CURETTAGE OF UTERUS     FOOT SURGERY Right 07/2007   foot (BUNION)   KNEE ARTHROSCOPY Left    POLYPECTOMY  2018   TA/SSA   TONSILLECTOMY  1953   torn maniscus  11/2010   left knee   VAGINAL HYSTERECTOMY  1994    FAMILY HISTORY Family History  Problem Relation Age of Onset   Lung cancer Father 9   Colon polyps Sister    Colon polyps Sister    Colon cancer Neg Hx    Esophageal cancer Neg Hx    Rectal cancer Neg Hx    Stomach cancer Neg Hx   The patient's father died from lung cancer in the setting of tobacco abuse. He was 76 years old. The patient's mother died at 71 with Alzheimer's disease. The patient had no brothers, 2 sisters. One sister was diagnosed with breast cancer at age 50. There is also a maternal cousin with breast cancer. There is no history of ovarian or prostate cancer in the family. The patient was genetically tested at [facility] and reportedly carries a heterozygous MutYH mutation. I do have requested that report   GYNECOLOGIC HISTORY:  No LMP recorded. Patient has had a hysterectomy. Menarche age 89, first live birth age 61, the patient is [name]. She underwent total abdominal hysterectomy with bilateral salpingo-oophorectomy approximately age 74. She took hormone replacement for approximately 17 years.   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 [name] is a retired Scientist, water quality. Daughter [name] is a Risk manager in [place]. Daughter [name] is Web designer at the country day school locally. The patient has 3 grandchildren. She is a Tourist information centre manager.    ADVANCED DIRECTIVES:    HEALTH MAINTENANCE: Social History   Tobacco  Use   Smoking status: Former    Types: Cigarettes    Quit date: 07/21/1968    Years since quitting: 53.6   Smokeless tobacco: Never  Vaping Use   Vaping Use: Never used  Substance Use Topics   Alcohol use: Yes    Comment: rarely wine   Drug use: No     Colonoscopy: May 2018; Nandigam  PAP:  Bone density:   Allergies  Allergen Reactions   Ciprofloxacin Swelling and Rash   Flagyl [Metronidazole] Swelling   Cefdinir Diarrhea   Celecoxib Swelling   Diazepam Other (See Comments)    Dpression/Make patient restless   Hydrocodone Other (See Comments)    Could not sleep   Hydrocodone-Acetaminophen     restlessness   Oxycodone Other (See Comments)    Restlessness   Sulfa Antibiotics Rash   Sulfonamide Derivatives Rash    Current Outpatient Medications  Medication Sig Dispense Refill   cholecalciferol (VITAMIN D) 1000 UNITS tablet Take 1,000 Units by mouth daily.       glucose blood test strip  1 each by Other route as needed for other. Use as instructed     lisinopril (ZESTRIL) 40 MG tablet Take 40 mg by mouth daily.     metFORMIN (GLUCOPHAGE) 500 MG tablet Take 500 mg by mouth daily after supper.       ONE TOUCH LANCETS MISC by Does not apply route.     Probiotic Product (ALIGN PO) Take 1 capsule by mouth daily.     rosuvastatin (CRESTOR) 5 MG tablet Take 1 tablet (5 mg total) by mouth daily.     vitamin B-12 (CYANOCOBALAMIN) 1000 MCG tablet Take 1,000 mcg by mouth daily.     Current Facility-Administered Medications  Medication Dose Route Frequency Provider Last Rate Last Admin   0.9 %  sodium chloride infusion  500 mL Intravenous Continuous Nandigam, Venia Minks, MD        OBJECTIVE: White woman in no acute distress  Vitals:   03/13/22 0939  BP: (!) 141/74  Pulse: 63  Resp: 18  Temp: (!) 97.5 F (36.4 C)  SpO2: 98%       Body mass index is 32.98 kg/m.   Wt Readings from Last 3 Encounters:  03/13/22 180 lb 4.8 oz (81.8 kg)  03/05/22 177 lb (80.3 kg)  09/13/21  179 lb 9.6 oz (81.5 kg)     ECOG FS:1 - Symptomatic but completely ambulatory  Physical Exam Constitutional:      Appearance: Normal appearance.  Cardiovascular:     Pulses: Normal pulses.     Heart sounds: Normal heart sounds.  Pulmonary:     Effort: Pulmonary effort is normal.     Breath sounds: Normal breath sounds.  Chest:     Comments: Bilateral breast exam, no palpable masses or regional adenopathy. Musculoskeletal:     Cervical back: Normal range of motion and neck supple. No rigidity.  Lymphadenopathy:     Cervical: No cervical adenopathy.  Skin:    General: Skin is warm and dry.     Coloration: Skin is not jaundiced or pale.  Neurological:     General: No focal deficit present.     Mental Status: She is alert. Mental status is at baseline.  Psychiatric:        Mood and Affect: Mood normal.      LAB RESULTS:  CMP     Component Value Date/Time   NA 140 03/01/2018 1026   NA 139 03/17/2017 1524   K 4.8 03/01/2018 1026   K 4.2 03/17/2017 1524   CL 108 03/01/2018 1026   CO2 23 03/01/2018 1026   CO2 23 03/17/2017 1524   GLUCOSE 101 (H) 03/01/2018 1026   GLUCOSE 102 03/17/2017 1524   BUN 27 (H) 03/01/2018 1026   BUN 28.6 (H) 03/17/2017 1524   CREATININE 1.01 (H) 03/01/2018 1026   CREATININE 0.9 03/17/2017 1524   CALCIUM 9.7 03/01/2018 1026   CALCIUM 9.8 03/17/2017 1524   PROT 7.0 03/01/2018 1026   PROT 6.8 03/17/2017 1524   ALBUMIN 3.6 03/01/2018 1026   ALBUMIN 3.8 03/17/2017 1524   AST 16 03/01/2018 1026   AST 15 03/17/2017 1524   ALT 17 03/01/2018 1026   ALT 16 03/17/2017 1524   ALKPHOS 90 03/01/2018 1026   ALKPHOS 88 03/17/2017 1524   BILITOT 0.4 03/01/2018 1026   BILITOT 0.31 03/17/2017 1524   GFRNONAA 54 (L) 03/01/2018 1026   GFRAA >60 03/01/2018 1026    Lab Results  Component Value Date   WBC 7.9 03/01/2018   NEUTROABS  4.5 03/01/2018   HGB 12.2 03/01/2018   HCT 37.8 03/01/2018   MCV 94.7 03/01/2018   PLT 176 03/01/2018      Chemistry       Component Value Date/Time   NA 140 03/01/2018 1026   NA 139 03/17/2017 1524   K 4.8 03/01/2018 1026   K 4.2 03/17/2017 1524   CL 108 03/01/2018 1026   CO2 23 03/01/2018 1026   CO2 23 03/17/2017 1524   BUN 27 (H) 03/01/2018 1026   BUN 28.6 (H) 03/17/2017 1524   CREATININE 1.01 (H) 03/01/2018 1026   CREATININE 0.9 03/17/2017 1524      Component Value Date/Time   CALCIUM 9.7 03/01/2018 1026   CALCIUM 9.8 03/17/2017 1524   ALKPHOS 90 03/01/2018 1026   ALKPHOS 88 03/17/2017 1524   AST 16 03/01/2018 1026   AST 15 03/17/2017 1524   ALT 17 03/01/2018 1026   ALT 16 03/17/2017 1524   BILITOT 0.4 03/01/2018 1026   BILITOT 0.31 03/17/2017 1524       No results found for: "LABCA2"  No components found for: "BCWUGQ916"  No results for input(s): "INR" in the last 168 hours.  No results found for: "LABCA2"  No results found for: "XIH038"  No results found for: "CAN125"  No results found for: "CAN153"  No results found for: "CA2729"  No components found for: "HGQUANT"  No results found for: "CEA1", "CEA" / No results found for: "CEA1", "CEA"   No results found for: "AFPTUMOR"  No results found for: "CHROMOGRNA"  No results found for: "TOTALPROTELP", "ALBUMINELP", "A1GS", "A2GS", "BETS", "BETA2SER", "GAMS", "MSPIKE", "SPEI" (this displays SPEP labs)  No results found for: "KPAFRELGTCHN", "LAMBDASER", "KAPLAMBRATIO" (kappa/lambda light chains)  No results found for: "HGBA", "HGBA2QUANT", "HGBFQUANT", "HGBSQUAN" (Hemoglobinopathy evaluation)   No results found for: "LDH"  No results found for: "IRON", "TIBC", "IRONPCTSAT" (Iron and TIBC)  No results found for: "FERRITIN"  Urinalysis No results found for: "COLORURINE", "APPEARANCEUR", "LABSPEC", "PHURINE", "GLUCOSEU", "HGBUR", "BILIRUBINUR", "KETONESUR", "PROTEINUR", "UROBILINOGEN", "NITRITE", "LEUKOCYTESUR"   STUDIES: No results found.   ELIGIBLE FOR AVAILABLE RESEARCH PROTOCOL: no  ASSESSMENT: 76 y.o.  [place], [place] woman status post right lumpectomy 02/20/2017 for 2 separate areas of invasive lobular carcinoma, grade 2, only one addressed in the pathology report, that one being pT1a pN0, stage IA, estrogen and progesterone receptor strongly positive, HER-2 not amplified  (a) the second area of known invasive lobular carcinoma was also included in the surgical sample; its dimensions and margins were to be addressed in a pathology addendum  (1) Oncotype not indicated and no chemotherapy is warranted  (2) adjuvant radiation to the right breast completed 05/25/2017  (3) exemestane started 07/27/2017, switched to exemestane April 2019  (a) bone density at [facility] 11/27/2015 shows a T score of +0.4  (b) repeat bone density 04/26/2018 shows a T score of -1.0   (4) per patient, she carries a heterozygous MutYH mutation   PLAN: She took estrogen for about 4-1/2 years, quit in August 2022.  Today her main complaint appears to be urinary incontinence but she is very hesitant to try the vaginal estrogen.  Today we have discussed about avoiding all caffeinated drinks afternoon and that drinking most of her water in the morning time.  She can also try coconut oil suppositories for vaginal dryness which may help the urinary incontinence.  I also strongly encouraged her to do some pelvic floor exercises to help with the urinary incontinence. She is currently doing some  physical therapy for her hips, she will talk to her physical therapist about teaching her some pelvic floor exercises.  If she still does not have any improvement after 4 weeks of intervention, she will go back to the urologist with whom she saw many years ago. Physical examination today without any concern for breast cancer recurrence She already has a mammogram scheduled for October 2023. She can return to clinic in 1 year.  Total encounter time 30 minutes.*  *Total Encounter Time as defined by the Centers for Medicare and Medicaid  Services includes, in addition to the face-to-face time of a patient visit (documented in the note above) non-face-to-face time: obtaining and reviewing outside history, ordering and reviewing medications, tests or procedures, care coordination (communications with other health care professionals or caregivers) and documentation in the medical record.

## 2022-03-17 ENCOUNTER — Encounter: Payer: Self-pay | Admitting: Gastroenterology

## 2022-04-01 ENCOUNTER — Ambulatory Visit (AMBULATORY_SURGERY_CENTER): Payer: PPO | Admitting: Gastroenterology

## 2022-04-01 ENCOUNTER — Encounter: Payer: Self-pay | Admitting: Gastroenterology

## 2022-04-01 VITALS — BP 134/71 | HR 65 | Temp 97.1°F | Resp 10 | Ht 62.0 in | Wt 177.0 lb

## 2022-04-01 DIAGNOSIS — D123 Benign neoplasm of transverse colon: Secondary | ICD-10-CM

## 2022-04-01 DIAGNOSIS — D120 Benign neoplasm of cecum: Secondary | ICD-10-CM

## 2022-04-01 DIAGNOSIS — Z09 Encounter for follow-up examination after completed treatment for conditions other than malignant neoplasm: Secondary | ICD-10-CM

## 2022-04-01 DIAGNOSIS — Z86010 Personal history of colonic polyps: Secondary | ICD-10-CM

## 2022-04-01 MED ORDER — SODIUM CHLORIDE 0.9 % IV SOLN: 500.0000 mL | Freq: Once | INTRAVENOUS | Status: AC

## 2022-04-01 NOTE — Progress Notes (Signed)
Called to room to assist during endoscopic procedure.  Patient ID and intended procedure confirmed with present staff. Received instructions for my participation in the procedure from the performing physician.  

## 2022-04-01 NOTE — Progress Notes (Signed)
Pt's states no medical or surgical changes since previsit or office visit. 

## 2022-04-01 NOTE — Progress Notes (Signed)
PT taken to PACU. Monitors in place. VSS. Report given to RN. 

## 2022-04-01 NOTE — Patient Instructions (Signed)
Read all of the handouts given to you by your recovery room nurse.  YOU HAD AN ENDOSCOPIC PROCEDURE TODAY AT THE [facility]:   Refer to the procedure report that was given to you for any specific questions about what was found during the examination.  If the procedure report does not answer your questions, please call your gastroenterologist to clarify.  If you requested that your care partner not be given the details of your procedure findings, then the procedure report has been included in a sealed envelope for you to review at your convenience later.  YOU SHOULD EXPECT: Some feelings of bloating in the abdomen. Passage of more gas than usual.  Walking can help get rid of the air that was put into your GI tract during the procedure and reduce the bloating. If you had a lower endoscopy (such as a colonoscopy or flexible sigmoidoscopy) you may notice spotting of blood in your stool or on the toilet paper. If you underwent a bowel prep for your procedure, you may not have a normal bowel movement for a few days.  Please Note:  You might notice some irritation and congestion in your nose or some drainage.  This is from the oxygen used during your procedure.  There is no need for concern and it should clear up in a day or so.  SYMPTOMS TO REPORT IMMEDIATELY:  Following lower endoscopy (colonoscopy or flexible sigmoidoscopy):  Excessive amounts of blood in the stool  Significant tenderness or worsening of abdominal pains  Swelling of the abdomen that is new, acute  Fever of 100F or higher   For urgent or emergent issues, a gastroenterologist can be reached at any hour by calling [phone]. Do not use MyChart messaging for urgent concerns.    DIET:  We do recommend a small meal at first, but then you may proceed to your regular diet.  Drink plenty of fluids but you should avoid alcoholic beverages for 24 hours.  ACTIVITY:  You should plan to take it easy for the rest of today and  you should NOT DRIVE or use heavy machinery until tomorrow (because of the sedation medicines used during the test).    FOLLOW UP: Our staff will call the number listed on your records the next business day following your procedure.  We will call around 7:15- 8:00 am to check on you and address any questions or concerns that you may have regarding the information given to you following your procedure. If we do not reach you, we will leave a message.     If any biopsies were taken you will be contacted by phone or by letter within the next 1-3 weeks.  Please call us at [phone] if you have not heard about the biopsies in 3 weeks.    SIGNATURES/CONFIDENTIALITY: You and/or your care partner have signed paperwork which will be entered into your electronic medical record.  These signatures attest to the fact that that the information above on your After Visit Summary has been reviewed and is understood.  Full responsibility of the confidentiality of this discharge information lies with you and/or your care-partner. 

## 2022-04-01 NOTE — Progress Notes (Signed)
[facility] History and Physical   Primary Care Physician:  Loraine Leriche., MD   Reason for Procedure:  History of adenomatous colon polyps  Plan:    Surveillance colonoscopy with possible interventions as needed     HPI: Diane Macias is a very pleasant 76 y.o. female here for surveillance colonoscopy. Denies any nausea, vomiting, abdominal pain, melena or bright red blood per rectum  The risks and benefits as well as alternatives of endoscopic procedure(s) have been discussed and reviewed. All questions answered. The patient agrees to proceed.    Past Medical History:  Diagnosis Date   Adenomatous colon polyp 04/18/2011   Arthritis    bilateral knees/RIGHT shoulder   Breast cancer ([place]) 2018   sx/radiation   Cancer ([place]) 1997   squamous and basal cell   Cataract    bilateral sx   Diabetes mellitus    type II-on meds   Hyperlipidemia    on meds   Hypertension    on meds   Osteopenia    Seasonal allergies    Sleep apnea    uses CPAP    Past Surgical History:  Procedure Laterality Date   arthroscopic knee Right 01/2008   knee   CATARACT EXTRACTION Bilateral 2016   CHOLECYSTECTOMY  1981   COLONOSCOPY  2018   KN-suprep(good)-tics/hems/TA/SSA   DILATION AND CURETTAGE OF UTERUS     FOOT SURGERY Right 07/2007   foot (BUNION)   KNEE ARTHROSCOPY Left    POLYPECTOMY  2018   TA/SSA   TONSILLECTOMY  1953   torn maniscus  11/2010   left knee   VAGINAL HYSTERECTOMY  1994    Prior to Admission medications   Medication Sig Start Date End Date Taking? Authorizing Provider  cholecalciferol (VITAMIN D) 1000 UNITS tablet Take 1,000 Units by mouth daily.     Yes [provider]  glucose blood test strip 1 each by Other route as needed for other. Use as instructed   Yes [provider]  lisinopril (ZESTRIL) 40 MG tablet Take 40 mg by mouth daily.   Yes [provider]  metFORMIN (GLUCOPHAGE) 500 MG tablet Take 500 mg by  mouth daily after supper.     Yes [provider]  ONE TOUCH LANCETS MISC by Does not apply route.   Yes [provider]  Probiotic Product (ALIGN PO) Take 1 capsule by mouth daily.   Yes [provider]  rosuvastatin (CRESTOR) 5 MG tablet Take 1 tablet (5 mg total) by mouth daily. 06/07/20  Yes Magrinat, Virgie Dad, MD  vitamin B-12 (CYANOCOBALAMIN) 1000 MCG tablet Take 1,000 mcg by mouth daily.   Yes [provider]    Current Outpatient Medications  Medication Sig Dispense Refill   cholecalciferol (VITAMIN D) 1000 UNITS tablet Take 1,000 Units by mouth daily.       glucose blood test strip 1 each by Other route as needed for other. Use as instructed     lisinopril (ZESTRIL) 40 MG tablet Take 40 mg by mouth daily.     metFORMIN (GLUCOPHAGE) 500 MG tablet Take 500 mg by mouth daily after supper.       ONE TOUCH LANCETS MISC by Does not apply route.     Probiotic Product (ALIGN PO) Take 1 capsule by mouth daily.     rosuvastatin (CRESTOR) 5 MG tablet Take 1 tablet (5 mg total) by mouth daily.     vitamin B-12 (CYANOCOBALAMIN) 1000 MCG tablet Take 1,000 mcg by  mouth daily.     Current Facility-Administered Medications  Medication Dose Route Frequency Provider Last Rate Last Admin   0.9 %  sodium chloride infusion  500 mL Intravenous Continuous Aryiah Monterosso V, MD       0.9 %  sodium chloride infusion  500 mL Intravenous Once Mauri Pole, MD        Allergies as of 04/01/2022 - Review Complete 04/01/2022  Allergen Reaction Noted   Ciprofloxacin Swelling and Rash 09/17/2015   Flagyl [metronidazole] Swelling 03/24/2015   Cefdinir Diarrhea 04/14/2019   Celecoxib Swelling 01/02/2009   Diazepam Other (See Comments) 01/02/2009   Hydrocodone Other (See Comments) 01/02/2009   Hydrocodone-acetaminophen  03/12/2017   Oxycodone Other (See Comments) 11/30/2014   Sulfa antibiotics Rash 02/14/2014   Sulfonamide derivatives Rash 01/02/2009    Family  History  Problem Relation Age of Onset   Lung cancer Father 11   Colon polyps Sister    Colon polyps Sister    Colon cancer Neg Hx    Esophageal cancer Neg Hx    Rectal cancer Neg Hx    Stomach cancer Neg Hx     Social History   Socioeconomic History   Marital status: Married    Spouse name: Not on file   Number of children: Not on file   Years of education: Not on file   Highest education level: Not on file  Occupational History   Not on file  Tobacco Use   Smoking status: Former    Types: Cigarettes    Quit date: 07/21/1968    Years since quitting: 53.7   Smokeless tobacco: Never  Vaping Use   Vaping Use: Never used  Substance and Sexual Activity   Alcohol use: Yes    Comment: rarely wine   Drug use: No   Sexual activity: Yes    Birth control/protection: None  Other Topics Concern   Not on file  Social History Narrative   Not on file   Social Determinants of Health   Financial Resource Strain: Not on file  Food Insecurity: Not on file  Transportation Needs: Not on file  Physical Activity: Not on file  Stress: Not on file  Social Connections: Not on file  Intimate Partner Violence: Not on file    Review of Systems:  All other review of systems negative except as mentioned in the HPI.  Physical Exam: Vital signs in last 24 hours: BP (!) 140/80   Pulse 72   Temp (!) 97.1 F (36.2 C)   Ht '5\' 2"'$  (1.575 m)   Wt 177 lb (80.3 kg)   SpO2 97%   BMI 32.37 kg/m  General:   Alert, NAD Lungs:  Clear .   Heart:  Regular rate and rhythm Abdomen:  Soft, nontender and nondistended. Neuro/Psych:  Alert and cooperative. Normal mood and affect. A and O x 3  Reviewed labs, radiology imaging, old records and pertinent past GI work up  Patient is appropriate for planned procedure(s) and anesthesia in an ambulatory setting   K. Denzil Magnuson , MD [phone]

## 2022-04-01 NOTE — Op Note (Signed)
[place] Patient Name: Diane Macias Procedure Date: 04/01/2022 9:49 AM MRN: [id number] Endoscopist: Mauri Pole , MD Age: 76 Referring MD:  Date of Birth: 1946-03-26 Gender: Female Account #: [id number] Procedure:                Colonoscopy Indications:              High risk colon cancer surveillance: Personal                            history of colonic polyps, High risk colon cancer                            surveillance: Personal history of multiple (3 or                            more) adenomas, High risk colon cancer                            surveillance: Personal history of adenoma less than                            10 mm in size Medicines:                Monitored Anesthesia Care Procedure:                Pre-Anesthesia Assessment:                           - Prior to the procedure, a History and Physical                            was performed, and patient medications and                            allergies were reviewed. The patient's tolerance of                            previous anesthesia was also reviewed. The risks                            and benefits of the procedure and the sedation                            options and risks were discussed with the patient.                            All questions were answered, and informed consent                            was obtained. Prior Anticoagulants: The patient has                            taken no previous anticoagulant or antiplatelet  agents. ASA Grade Assessment: II - A patient with                            mild systemic disease. After reviewing the risks                            and benefits, the patient was deemed in                            satisfactory condition to undergo the procedure.                           After obtaining informed consent, the colonoscope                            was passed under direct vision. Throughout the                             procedure, the patient's blood pressure, pulse, and                            oxygen saturations were monitored continuously. The                            Olympus PCF-H190DL ([id number]) Colonoscope was                            introduced through the anus and advanced to the the                            cecum, identified by appendiceal orifice and                            ileocecal valve. The colonoscopy was performed                            without difficulty. The patient tolerated the                            procedure well. The quality of the bowel                            preparation was good. The ileocecal valve,                            appendiceal orifice, and rectum were photographed. Scope In: 9:54:01 AM Scope Out: 10:11:43 AM Scope Withdrawal Time: 0 hours 8 minutes 50 seconds  Total Procedure Duration: 0 hours 17 minutes 42 seconds  Findings:                 The perianal and digital rectal examinations were                            normal.  Three sessile polyps were found in the transverse                            colon and cecum. The polyps were 4 to 8 mm in size.                            These polyps were removed with a cold snare.                            Resection and retrieval were complete.                           Scattered small and large-mouthed diverticula were                            found in the sigmoid colon, descending colon,                            transverse colon and ascending colon. There was                            evidence of diverticular spasm. Peri-diverticular                            erythema was seen. There was evidence of an                            impacted diverticulum.                           Non-bleeding external and internal hemorrhoids were                            found during retroflexion. The hemorrhoids were                             medium-sized. Complications:            No immediate complications. Estimated Blood Loss:     Estimated blood loss was minimal. Impression:               - Three 4 to 8 mm polyps in the transverse colon                            and in the cecum, removed with a cold snare.                            Resected and retrieved.                           - Severe diverticulosis in the sigmoid colon, in                            the descending colon, in the transverse colon and  in the ascending colon. There was evidence of                            diverticular spasm. Peri-diverticular erythema was                            seen. There was evidence of an impacted                            diverticulum.                           - Non-bleeding external and internal hemorrhoids. Recommendation:           - Patient has a contact number available for                            emergencies. The signs and symptoms of potential                            delayed complications were discussed with the                            patient. Return to normal activities tomorrow.                            Written discharge instructions were provided to the                            patient.                           - Resume previous diet.                           - Continue present medications.                           - Await pathology results.                           - No repeat colonoscopy due to age. Mauri Pole, MD 04/01/2022 10:21:54 AM This report has been signed electronically.

## 2022-04-02 ENCOUNTER — Telehealth: Payer: Self-pay

## 2022-04-02 NOTE — Telephone Encounter (Signed)
Attempted to reach patient for post-procedure f/u call. No answer. Left message for her to please not hesitate to call us if she has any questions/concerns regarding her care. 

## 2022-04-03 ENCOUNTER — Encounter: Payer: Self-pay | Admitting: Gastroenterology

## 2023-03-16 ENCOUNTER — Inpatient Hospital Stay: Payer: PPO | Attending: Hematology and Oncology | Admitting: Hematology and Oncology

## 2023-03-16 ENCOUNTER — Encounter: Payer: Self-pay | Admitting: Hematology and Oncology

## 2023-03-16 VITALS — BP 139/69 | HR 74 | Temp 97.9°F | Resp 16 | Wt 179.7 lb

## 2023-03-16 DIAGNOSIS — E119 Type 2 diabetes mellitus without complications: Secondary | ICD-10-CM | POA: Insufficient documentation

## 2023-03-16 DIAGNOSIS — Z923 Personal history of irradiation: Secondary | ICD-10-CM | POA: Diagnosis not present

## 2023-03-16 DIAGNOSIS — Z885 Allergy status to narcotic agent status: Secondary | ICD-10-CM | POA: Diagnosis not present

## 2023-03-16 DIAGNOSIS — Z87891 Personal history of nicotine dependence: Secondary | ICD-10-CM | POA: Diagnosis not present

## 2023-03-16 DIAGNOSIS — Z170 Estrogen receptor positive status [ER+]: Secondary | ICD-10-CM | POA: Insufficient documentation

## 2023-03-16 DIAGNOSIS — Z803 Family history of malignant neoplasm of breast: Secondary | ICD-10-CM | POA: Insufficient documentation

## 2023-03-16 DIAGNOSIS — Z801 Family history of malignant neoplasm of trachea, bronchus and lung: Secondary | ICD-10-CM | POA: Diagnosis not present

## 2023-03-16 DIAGNOSIS — Z83719 Family history of colon polyps, unspecified: Secondary | ICD-10-CM | POA: Diagnosis not present

## 2023-03-16 DIAGNOSIS — Z90710 Acquired absence of both cervix and uterus: Secondary | ICD-10-CM | POA: Insufficient documentation

## 2023-03-16 DIAGNOSIS — Z1509 Genetic susceptibility to other malignant neoplasm: Secondary | ICD-10-CM | POA: Diagnosis not present

## 2023-03-16 DIAGNOSIS — Z79899 Other long term (current) drug therapy: Secondary | ICD-10-CM | POA: Diagnosis not present

## 2023-03-16 DIAGNOSIS — M8580 Other specified disorders of bone density and structure, unspecified site: Secondary | ICD-10-CM | POA: Diagnosis not present

## 2023-03-16 DIAGNOSIS — Z90722 Acquired absence of ovaries, bilateral: Secondary | ICD-10-CM | POA: Insufficient documentation

## 2023-03-16 DIAGNOSIS — Z882 Allergy status to sulfonamides status: Secondary | ICD-10-CM | POA: Insufficient documentation

## 2023-03-16 DIAGNOSIS — Z881 Allergy status to other antibiotic agents status: Secondary | ICD-10-CM | POA: Diagnosis not present

## 2023-03-16 DIAGNOSIS — Z9049 Acquired absence of other specified parts of digestive tract: Secondary | ICD-10-CM | POA: Diagnosis not present

## 2023-03-16 DIAGNOSIS — K5900 Constipation, unspecified: Secondary | ICD-10-CM | POA: Insufficient documentation

## 2023-03-16 DIAGNOSIS — C50811 Malignant neoplasm of overlapping sites of right female breast: Secondary | ICD-10-CM | POA: Diagnosis present

## 2023-03-16 DIAGNOSIS — N6315 Unspecified lump in the right breast, overlapping quadrants: Secondary | ICD-10-CM | POA: Insufficient documentation

## 2023-03-16 DIAGNOSIS — Z1501 Genetic susceptibility to malignant neoplasm of breast: Secondary | ICD-10-CM | POA: Diagnosis not present

## 2023-03-16 NOTE — Progress Notes (Signed)
[facility]  [phone] [phone]     ID: Diane Macias DOB: 1946-05-31  MR#: [id number]  AOZ#:[id number]  Patient Care Team: Diane Macias., MD as PCP - General (Internal Medicine) Diane Neighbours, MD as Referring Physician (Surgery) Magrinat, Valentino Hue, MD (Inactive) as Consulting Physician (Oncology) Diane Hare, MD as Referring Physician (Radiation Oncology) Diane Form, MD as Consulting Physician (Gastroenterology) OTHER MD:  CHIEF COMPLAINT: Estrogen receptor positive lobular breast cancer   CURRENT TREATMENT: Surveillance.  INTERVAL HISTORY:  Diane Macias returns today for follow up of her estrogen receptor positive lobular breast cancer.  Since her last visit, her urinary incontinence is better on mybetriq. She however notices muscle stiffness, some discomfort in the chest wall when she wakes up, this improves when she starts moving. Vaginal dryness is about the same, she doesn't want to try rehab. She is also reluctant to try vaginal estrogen. She is now taking stool softeners and laxative for constipation. She wonders if her belching and indigestion could be related to these medications. No other change in bowel habits.  She most recently had a mammogram right breast for some pain, no concerns noted, she will get back to screening mammogram in oct 2024. Rest of the pertinent 10 point ROS reviewed and negative  REVIEW OF SYSTEMS:   A detailed review of systems today is benign except as noted above   COVID 19 VACCINATION STATUS: Status post Pfizer x2+ booster September 2021   HISTORY OF CURRENT ILLNESS: From the original intake note:  Diane Macias had bilateral screening mammography at [facility] 11/30/2014 showing the breast density to be category B. There was a possible mass in the right breast. On 12/10/2016 she underwent right diagnostic mammography with tomography and right breast ultrasonography. This confirmed a 0.4  cm spiculated mass in the periareolar area of the right breast. Ultrasound showed a small irregular hypoechoic mass at the 9:00 position 1 cm from the nipple, measuring 0.5 cm. Ultrasound of the right axilla was benign.  Biopsy of the right breast area in question 12/25/2016 showed an invasive lobular carcinoma, grade 2, estrogen receptor positive at 85%, progesterone receptor positive at 65%, both with strong staining intensity, and HER-2 not amplified by immunohistochemistry (1+).   On 01/19/2017 she underwent bilateral breast MRIs which showed in addition to the known right breast mass an additional area of concern in the central right breast. This measured 0.6 cm. MRI guided biopsy 02/06/2017 confirmed that this second area also was invasive lobular carcinoma, grade 2. It was located (by MRI) 1.5 cm inferior medial to the previously biopsied mass. Also there was a 0.6 mm sternal lesion in the inferior aspect of the sternum.  Because of the sternal lesion on the patient underwent PET scanning 02/12/2017. This was entirely negative and the sternal lesion is felt to be most consistent with a bone island  On 02/20/2017 the patient underwent right lumpectomy. This showed ([id number]) invasive lobular carcinoma measuring 0.5 cm, grade 2, with evidence of lobular carcinoma in situ. Margins were clear. Both sentinel lymph nodes were clear. [NB: the second biopsied lesion was present in the surgical sample but not addressed in the report; I have discussed this with pathology and Diane Macias has kindly agreed to review the case, measure this lesion and margins, and dictate an addendum]  The patient's subsequent history is as detailed below.   PAST MEDICAL HISTORY: Past Medical History:  Diagnosis Date   Adenomatous colon polyp 04/18/2011   Arthritis  bilateral knees/RIGHT shoulder   Breast cancer (HCC) 2018   sx/radiation   Cancer (HCC) 1997   squamous and basal cell   Cataract    bilateral sx    Diabetes mellitus    type II-on meds   Hyperlipidemia    on meds   Hypertension    on meds   Osteopenia    Seasonal allergies    Sleep apnea    uses CPAP    PAST SURGICAL HISTORY: Past Surgical History:  Procedure Laterality Date   arthroscopic knee Right 01/2008   knee   CATARACT EXTRACTION Bilateral 2016   CHOLECYSTECTOMY  1981   COLONOSCOPY  2018   KN-suprep(good)-tics/hems/TA/SSA   DILATION AND CURETTAGE OF UTERUS     FOOT SURGERY Right 07/2007   foot (BUNION)   KNEE ARTHROSCOPY Left    POLYPECTOMY  2018   TA/SSA   TONSILLECTOMY  1953   torn maniscus  11/2010   left knee   VAGINAL HYSTERECTOMY  1994    FAMILY HISTORY Family History  Problem Relation Age of Onset   Lung cancer Father 66   Colon polyps Sister    Colon polyps Sister    Colon cancer Neg Hx    Esophageal cancer Neg Hx    Rectal cancer Neg Hx    Stomach cancer Neg Hx   The patient's father died from lung cancer in the setting of tobacco abuse. He was 77 years old. The patient's mother died at 8 with Alzheimer's disease. The patient had no brothers, 2 sisters. One sister was diagnosed with breast cancer at age 64. There is also a maternal cousin with breast cancer. There is no history of ovarian or prostate cancer in the family. The patient was genetically tested at [facility] and reportedly carries a heterozygous MutYH mutation. I do have requested that report   GYNECOLOGIC HISTORY:  No LMP recorded. Patient has had a hysterectomy. Menarche age 65, first live birth age 56, the patient is GX P2. She underwent total abdominal hysterectomy with bilateral salpingo-oophorectomy approximately age 53. She took hormone replacement for approximately 17 years.   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 Greggory Stallion is a retired Market researcher. Daughter Lupe Carney is a Set designer in [place]. Daughter Morrie Sheldon is Environmental health practitioner at the country day school locally. The patient  has 3 grandchildren. She is a International aid/development worker.    ADVANCED DIRECTIVES:    HEALTH MAINTENANCE: Social History   Tobacco Use   Smoking status: Former    Current packs/day: 0.00    Types: Cigarettes    Quit date: 07/21/1968    Years since quitting: 54.6   Smokeless tobacco: Never  Vaping Use   Vaping status: Never Used  Substance Use Topics   Alcohol use: Yes    Comment: rarely wine   Drug use: No     Colonoscopy: May 2018; Nandigam  PAP:  Bone density:   Allergies  Allergen Reactions   Ciprofloxacin Swelling and Rash   Flagyl [Metronidazole] Swelling   Cefdinir Diarrhea   Celecoxib Swelling   Diazepam Other (See Comments)    Dpression/Make patient restless   Hydrocodone Other (See Comments)    Could not sleep   Hydrocodone-Acetaminophen     restlessness   Oxycodone Other (See Comments)    Restlessness   Sulfa Antibiotics Rash   Sulfonamide Derivatives Rash    Current Outpatient Medications  Medication Sig Dispense Refill   cholecalciferol (VITAMIN D) 1000 UNITS  tablet Take 1,000 Units by mouth daily.       glucose blood test strip 1 each by Other route as needed for other. Use as instructed     lisinopril (ZESTRIL) 40 MG tablet Take 40 mg by mouth daily.     metFORMIN (GLUCOPHAGE) 500 MG tablet Take 500 mg by mouth daily after supper.       ONE TOUCH LANCETS MISC by Does not apply route.     Probiotic Product (ALIGN PO) Take 1 capsule by mouth daily.     rosuvastatin (CRESTOR) 5 MG tablet Take 1 tablet (5 mg total) by mouth daily.     vitamin B-12 (CYANOCOBALAMIN) 1000 MCG tablet Take 1,000 mcg by mouth daily.     Current Facility-Administered Medications  Medication Dose Route Frequency Provider Last Rate Last Admin   0.9 %  sodium chloride infusion  500 mL Intravenous Once Nandigam, Eleonore Chiquito, MD        OBJECTIVE: White woman in no acute distress  There were no vitals filed for this visit.      There is no height or weight on file to calculate BMI.   Wt  Readings from Last 3 Encounters:  04/01/22 177 lb (80.3 kg)  03/13/22 180 lb 4.8 oz (81.8 kg)  03/05/22 177 lb (80.3 kg)     ECOG FS:1 - Symptomatic but completely ambulatory  Physical Exam Constitutional:      Appearance: Normal appearance.  Cardiovascular:     Pulses: Normal pulses.     Heart sounds: Normal heart sounds.  Pulmonary:     Effort: Pulmonary effort is normal.     Breath sounds: Normal breath sounds.  Chest:     Comments: Bilateral breast exam, no palpable masses or regional adenopathy. Musculoskeletal:     Cervical back: Normal range of motion and neck supple. No rigidity.  Lymphadenopathy:     Cervical: No cervical adenopathy.  Skin:    General: Skin is warm and dry.     Coloration: Skin is not jaundiced or pale.  Neurological:     General: No focal deficit present.     Mental Status: She is alert. Mental status is at baseline.  Psychiatric:        Mood and Affect: Mood normal.       LAB RESULTS:  CMP     Component Value Date/Time   NA 140 03/01/2018 1026   NA 139 03/17/2017 1524   K 4.8 03/01/2018 1026   K 4.2 03/17/2017 1524   CL 108 03/01/2018 1026   CO2 23 03/01/2018 1026   CO2 23 03/17/2017 1524   GLUCOSE 101 (H) 03/01/2018 1026   GLUCOSE 102 03/17/2017 1524   BUN 27 (H) 03/01/2018 1026   BUN 28.6 (H) 03/17/2017 1524   CREATININE 1.01 (H) 03/01/2018 1026   CREATININE 0.9 03/17/2017 1524   CALCIUM 9.7 03/01/2018 1026   CALCIUM 9.8 03/17/2017 1524   PROT 7.0 03/01/2018 1026   PROT 6.8 03/17/2017 1524   ALBUMIN 3.6 03/01/2018 1026   ALBUMIN 3.8 03/17/2017 1524   AST 16 03/01/2018 1026   AST 15 03/17/2017 1524   ALT 17 03/01/2018 1026   ALT 16 03/17/2017 1524   ALKPHOS 90 03/01/2018 1026   ALKPHOS 88 03/17/2017 1524   BILITOT 0.4 03/01/2018 1026   BILITOT 0.31 03/17/2017 1524   GFRNONAA 54 (L) 03/01/2018 1026   GFRAA >60 03/01/2018 1026    Lab Results  Component Value Date   WBC 7.9 03/01/2018  NEUTROABS 4.5 03/01/2018   HGB  12.2 03/01/2018   HCT 37.8 03/01/2018   MCV 94.7 03/01/2018   PLT 176 03/01/2018      Chemistry      Component Value Date/Time   NA 140 03/01/2018 1026   NA 139 03/17/2017 1524   K 4.8 03/01/2018 1026   K 4.2 03/17/2017 1524   CL 108 03/01/2018 1026   CO2 23 03/01/2018 1026   CO2 23 03/17/2017 1524   BUN 27 (H) 03/01/2018 1026   BUN 28.6 (H) 03/17/2017 1524   CREATININE 1.01 (H) 03/01/2018 1026   CREATININE 0.9 03/17/2017 1524      Component Value Date/Time   CALCIUM 9.7 03/01/2018 1026   CALCIUM 9.8 03/17/2017 1524   ALKPHOS 90 03/01/2018 1026   ALKPHOS 88 03/17/2017 1524   AST 16 03/01/2018 1026   AST 15 03/17/2017 1524   ALT 17 03/01/2018 1026   ALT 16 03/17/2017 1524   BILITOT 0.4 03/01/2018 1026   BILITOT 0.31 03/17/2017 1524       No results found for: "LABCA2"  No components found for: "NWGNFA213"  No results for input(s): "INR" in the last 168 hours.  No results found for: "LABCA2"  No results found for: "YQM578"  No results found for: "CAN125"  No results found for: "CAN153"  No results found for: "CA2729"  No components found for: "HGQUANT"  No results found for: "CEA1", "CEA" / No results found for: "CEA1", "CEA"   No results found for: "AFPTUMOR"  No results found for: "CHROMOGRNA"  No results found for: "TOTALPROTELP", "ALBUMINELP", "A1GS", "A2GS", "BETS", "BETA2SER", "GAMS", "MSPIKE", "SPEI" (this displays SPEP labs)  No results found for: "KPAFRELGTCHN", "LAMBDASER", "KAPLAMBRATIO" (kappa/lambda light chains)  No results found for: "HGBA", "HGBA2QUANT", "HGBFQUANT", "HGBSQUAN" (Hemoglobinopathy evaluation)   No results found for: "LDH"  No results found for: "IRON", "TIBC", "IRONPCTSAT" (Iron and TIBC)  No results found for: "FERRITIN"  Urinalysis No results found for: "COLORURINE", "APPEARANCEUR", "LABSPEC", "PHURINE", "GLUCOSEU", "HGBUR", "BILIRUBINUR", "KETONESUR", "PROTEINUR", "UROBILINOGEN", "NITRITE",  "LEUKOCYTESUR"   STUDIES: No results found.   ELIGIBLE FOR AVAILABLE RESEARCH PROTOCOL: no  ASSESSMENT: 77 y.o. [place], [place] woman status post right lumpectomy 02/20/2017 for 2 separate areas of invasive lobular carcinoma, grade 2, only one addressed in the pathology report, that one being pT1a pN0, stage IA, estrogen and progesterone receptor strongly positive, HER-2 not amplified  (a) the second area of known invasive lobular carcinoma was also included in the surgical sample; its dimensions and margins were to be addressed in a pathology addendum  (1) Oncotype not indicated and no chemotherapy is warranted  (2) adjuvant radiation to the right breast completed 05/25/2017  (3) exemestane started 07/27/2017, switched to exemestane April 2019  (a) bone density at [facility] 11/27/2015 shows a T score of +0.4  (b) repeat bone density 04/26/2018 shows a T score of -1.0   (4) per patient, she carries a heterozygous MutYH mutation   PLAN: She took anti estrogen for about 4-1/2 years, quit in August 2022.  Physical examination today without any concern for breast cancer recurrence Last mammogram in October 2023 and May 2024 without any major concerns, she will be due for screening in October 2024 again.  She does this at [facility] system, this is not usually ordered by [place]. Today we have once again discussed about vaginal dryness, urinary incontinence again. Pain she describes in the chest wall as well as a hip which improves on moving is likely muscle stiffness.  I encouraged some weightbearing exercises. She also complained of some belching and indigestion for the past couple weeks.  I will follow-up on this with a telephone visit in the next few weeks.  If this does not improve, we will send her back to gastroenterology for further evaluation.  Total encounter time 30 minutes.*  *Total Encounter Time as defined by the Centers for Medicare and Medicaid Services includes, in  addition to the face-to-face time of a patient visit (documented in the note above) non-face-to-face time: obtaining and reviewing outside history, ordering and reviewing medications, tests or procedures, care coordination (communications with other health care professionals or caregivers) and documentation in the medical record.

## 2023-04-01 ENCOUNTER — Other Ambulatory Visit: Payer: Self-pay

## 2023-04-01 ENCOUNTER — Telehealth: Payer: Self-pay

## 2023-04-01 ENCOUNTER — Ambulatory Visit (HOSPITAL_BASED_OUTPATIENT_CLINIC_OR_DEPARTMENT_OTHER)
Admission: RE | Admit: 2023-04-01 | Discharge: 2023-04-01 | Disposition: A | Discharge status: Home / Self Care | Payer: PPO | Source: Ambulatory Visit | Attending: Gastroenterology | Admitting: Gastroenterology

## 2023-04-01 ENCOUNTER — Other Ambulatory Visit (INDEPENDENT_AMBULATORY_CARE_PROVIDER_SITE_OTHER): Payer: PPO

## 2023-04-01 DIAGNOSIS — K5732 Diverticulitis of large intestine without perforation or abscess without bleeding: Secondary | ICD-10-CM | POA: Insufficient documentation

## 2023-04-01 LAB — CBC WITH DIFFERENTIAL/PLATELET
Basophils Absolute: 0.1 10*3/uL (ref 0.0–0.1)
Basophils Relative: 0.5 % (ref 0.0–3.0)
Eosinophils Absolute: 0.1 10*3/uL (ref 0.0–0.7)
Eosinophils Relative: 0.7 % (ref 0.0–5.0)
HCT: 35.2 % — ABNORMAL LOW (ref 36.0–46.0)
Hemoglobin: 11.6 g/dL — ABNORMAL LOW (ref 12.0–15.0)
Lymphocytes Relative: 26.8 % (ref 12.0–46.0)
Lymphs Abs: 3 10*3/uL (ref 0.7–4.0)
MCHC: 33 g/dL (ref 30.0–36.0)
MCV: 91.1 fl (ref 78.0–100.0)
Monocytes Absolute: 1 10*3/uL (ref 0.1–1.0)
Monocytes Relative: 8.8 % (ref 3.0–12.0)
Neutro Abs: 7 10*3/uL (ref 1.4–7.7)
Neutrophils Relative %: 63.2 % (ref 43.0–77.0)
Platelets: 159 10*3/uL (ref 150.0–400.0)
RBC: 3.86 Mil/uL — ABNORMAL LOW (ref 3.87–5.11)
RDW: 13.8 % (ref 11.5–15.5)
WBC: 11 10*3/uL — ABNORMAL HIGH (ref 4.0–10.5)

## 2023-04-01 LAB — BASIC METABOLIC PANEL
BUN: 20 mg/dL (ref 6–23)
CO2: 24 meq/L (ref 19–32)
Calcium: 9.4 mg/dL (ref 8.4–10.5)
Chloride: 103 meq/L (ref 96–112)
Creatinine, Ser: 1.01 mg/dL (ref 0.40–1.20)
GFR: 53.82 mL/min — ABNORMAL LOW (ref >60.00)
Glucose, Bld: 100 mg/dL — ABNORMAL HIGH (ref 70–99)
Potassium: 4.1 meq/L (ref 3.5–5.1)
Sodium: 134 meq/L — ABNORMAL LOW (ref 135–145)

## 2023-04-01 MED ORDER — AMOXICILLIN-POT CLAVULANATE 875-125 MG PO TABS
1.0000 | ORAL_TABLET | Freq: Two times a day (BID) | ORAL | 0 refills | Status: DC
Start: 2023-04-01 — End: 2024-03-15

## 2023-04-01 MED ORDER — IOHEXOL 300 MG/ML  SOLN
100.0000 mL | Freq: Once | INTRAMUSCULAR | Status: AC | PRN
  Administered 2023-04-01: 100 mL via INTRAVENOUS

## 2023-04-01 NOTE — Telephone Encounter (Signed)
Inbound call from patient requesting to know if it is okay to take her regular medications today. Patient requesting a call back. Please advise, thank you.

## 2023-04-01 NOTE — Telephone Encounter (Signed)
Reviewed medications. Advised to continue her regular medications. Do not take fiber supplements or probiotics.

## 2023-04-01 NOTE — Telephone Encounter (Signed)
Inbound call from patient experiencing bloating and indigestion Patient states for the last 3 days she has had a constant pain on her left side.   Offered next available appointment but patient prefers to speak with a nurse instead.   Inquiring medication recommendations. Please advise.  Verifying pharmacy [facility] [address] in [place].   Thank you

## 2023-04-01 NOTE — Telephone Encounter (Signed)
Spoke with the patient who reports history of diverticulitis. Two days ago (today is day 3) she had a sudden onset of feeling constipated, having an "ache" in her left side, indigestion and bloating. Today she is "miserable" and feels certain she has diverticulitis. She took Miralax yesterday with little results. She has increased her water intake and is on a liquid diet. She denies having fever. Endorses body aches and agrees to take Tylenol for this. She is allergic to Cipro and Flagyl. No PCN allergy. Please advise.

## 2023-04-01 NOTE — Telephone Encounter (Signed)
Please send prescription for Augmentin 875 twice daily for 10 days.  Please obtain BMP, CBC, CT abdomen pelvis with contrast to exclude any possible complications from acute diverticulitis.  Follow-up in office visit with APP in 2 to 4 weeks.  Thank you

## 2023-04-06 ENCOUNTER — Ambulatory Visit: Payer: PPO | Admitting: Gastroenterology

## 2023-04-10 ENCOUNTER — Telehealth: Payer: Self-pay | Admitting: Gastroenterology

## 2023-04-10 NOTE — Telephone Encounter (Signed)
Patient is scheduled for follow up of presumed diverticulitis on 04/13/23. Called the patient back. No answer. Left her a voicemail recommending she call her PCP for advise to treat her symptoms.

## 2023-04-10 NOTE — Telephone Encounter (Signed)
Patient called stated she has an appt coming up but has some allergies and is really stuffy. Seeking advise.

## 2023-04-13 ENCOUNTER — Ambulatory Visit: Payer: PPO | Admitting: Gastroenterology

## 2023-04-13 ENCOUNTER — Encounter: Payer: Self-pay | Admitting: Gastroenterology

## 2023-04-13 VITALS — BP 130/78 | HR 68 | Ht 61.0 in | Wt 177.0 lb

## 2023-04-13 DIAGNOSIS — Z8719 Personal history of other diseases of the digestive system: Secondary | ICD-10-CM

## 2023-04-13 DIAGNOSIS — K5732 Diverticulitis of large intestine without perforation or abscess without bleeding: Secondary | ICD-10-CM

## 2023-04-13 NOTE — Patient Instructions (Signed)
Follow up as needed   If your blood pressure at your visit was 140/90 or greater, please contact your primary care physician to follow up on this.  _______________________________________________________  If you are age 77 or older, your body mass index should be between 23-30. Your Body mass index is 33.44 kg/m. If this is out of the aforementioned range listed, please consider follow up with your Primary Care Provider.  If you are age 33 or younger, your body mass index should be between 19-25. Your Body mass index is 33.44 kg/m. If this is out of the aformentioned range listed, please consider follow up with your Primary Care Provider.   ________________________________________________________  The [place] [facility] providers would like to encourage you to use [facility] to communicate with providers for non-urgent requests or questions.  Due to long hold times on the telephone, sending your provider a message by [facility] may be a faster and more efficient way to get a response.  Please allow 48 business hours for a response.  Please remember that this is for non-urgent requests.  _______________________________________________________    Thank you for trusting me with your gastrointestinal care!   Boone Master, PA

## 2023-04-13 NOTE — Progress Notes (Signed)
Chief Complaint: diverticulitis follow up Primary GI MD: Dr. Lavon Paganini  HPI: 77 year old female history of breast cancer s/p radiation, and others as listed below presents for evaluation of follow-up of diverticulitis.  Patient recently called in 04/01/2023 reporting persistent LLQ pain, bloating, indigestion that felt similar to diverticulitis.  Dr. Lavon Paganini sent in Augmentin 875 twice daily for 10 days for patient and obtained BMP, CBC, and CT abdomen pelvis with contrast.  CT abdomen pelvis with contrast showed acute uncomplicated diverticulitis of the sigmoid colon without evidence of perforation or abscess.  CBC notable for leukocytosis with WBC 11.0.  Slight anemia with Hgb 11.6.  BMP unrevealing  Patient states she completed her abx about 2 days ago and is feeling much improved.  She no longer has abdominal pain and is back to her baseline constipation and ready to restart her MiraLAX and probiotic.  States she has not had a bout of diverticulitis since 6 years ago.  She is up-to-date on her colonoscopy.  Doing well at this time.   PREVIOUS GI WORKUP   Colonoscopy 04/01/2022 for history of colon polyps - 3 4 to 8 mm polyps in the transverse colon and cecum.  Resected and retrieved. - Severe diverticulosis in sigmoid, descending, transverse, ascending colon.  Peridiverticular erythema was seen.  There was not evidence of impacted diverticulum. - Nonbleeding external/internal hemorrhoids - No repeat due to age  Past Medical History:  Diagnosis Date   Adenomatous colon polyp 04/18/2011   Arthritis    bilateral knees/RIGHT shoulder   Breast cancer (HCC) 2018   sx/radiation   Cancer (HCC) 1997   squamous and basal cell   Cataract    bilateral sx   Diabetes mellitus    type II-on meds   Hyperlipidemia    on meds   Hypertension    on meds   Osteopenia    Seasonal allergies    Sleep apnea    uses CPAP    Past Surgical History:  Procedure Laterality Date   arthroscopic  knee Right 01/2008   knee   CATARACT EXTRACTION Bilateral 2016   CHOLECYSTECTOMY  1981   COLONOSCOPY  2018   KN-suprep(good)-tics/hems/TA/SSA   DILATION AND CURETTAGE OF UTERUS     FOOT SURGERY Right 07/2007   foot (BUNION)   KNEE ARTHROSCOPY Left    POLYPECTOMY  2018   TA/SSA   TONSILLECTOMY  1953   torn maniscus  11/2010   left knee   VAGINAL HYSTERECTOMY  1994    Current Outpatient Medications  Medication Sig Dispense Refill   amoxicillin-clavulanate (AUGMENTIN) 875-125 MG tablet Take 1 tablet by mouth 2 (two) times daily. 20 tablet 0   cholecalciferol (VITAMIN D) 1000 UNITS tablet Take 1,000 Units by mouth daily.       glucose blood test strip 1 each by Other route as needed for other. Use as instructed     lisinopril (ZESTRIL) 40 MG tablet Take 40 mg by mouth daily.     metFORMIN (GLUCOPHAGE) 500 MG tablet Take 500 mg by mouth daily after supper.       ONE TOUCH LANCETS MISC by Does not apply route.     Probiotic Product (ALIGN PO) Take 1 capsule by mouth daily.     rosuvastatin (CRESTOR) 5 MG tablet Take 1 tablet (5 mg total) by mouth daily.     vitamin B-12 (CYANOCOBALAMIN) 1000 MCG tablet Take 1,000 mcg by mouth daily.     Current Facility-Administered Medications  Medication Dose Route Frequency Provider  Last Rate Last Admin   0.9 %  sodium chloride infusion  500 mL Intravenous Once Napoleon Form, MD        Allergies as of 04/13/2023 - Review Complete 03/16/2023  Allergen Reaction Noted   Ciprofloxacin Swelling and Rash 09/17/2015   Flagyl [metronidazole] Swelling 03/24/2015   Cefdinir Diarrhea 04/14/2019   Celecoxib Swelling 01/02/2009   Diazepam Other (See Comments) 01/02/2009   Hydrocodone Other (See Comments) 01/02/2009   Hydrocodone-acetaminophen Other (See Comments) 03/12/2017   Oxycodone Other (See Comments) 11/30/2014   Sulfa antibiotics Rash 02/14/2014   Sulfonamide derivatives Rash 01/02/2009    Family History  Problem Relation Age of Onset    Lung cancer Father 37   Colon polyps Sister    Colon polyps Sister    Colon cancer Neg Hx    Esophageal cancer Neg Hx    Rectal cancer Neg Hx    Stomach cancer Neg Hx     Social History   Socioeconomic History   Marital status: Married    Spouse name: Not on file   Number of children: Not on file   Years of education: Not on file   Highest education level: Not on file  Occupational History   Not on file  Tobacco Use   Smoking status: Former    Current packs/day: 0.00    Types: Cigarettes    Quit date: 07/21/1968    Years since quitting: 54.7   Smokeless tobacco: Never  Vaping Use   Vaping status: Never Used  Substance and Sexual Activity   Alcohol use: Yes    Comment: rarely wine   Drug use: No   Sexual activity: Yes    Birth control/protection: None  Other Topics Concern   Not on file  Social History Narrative   Not on file   Social Determinants of Health   Financial Resource Strain: Low Risk  (12/02/2021)   [facility] to [facility]20[facility] visits prior to 09/20/2022.   Overall Financial Resource Strain (CARDIA)    Difficulty of Paying Living Expenses: Not hard at all  Food Insecurity: Low Risk  (07/24/2022)   [facility] from [facility] visits prior to 09/20/2022.   Food    Within the past 12 months, you worried that your food would run out before you got money to buy more food: Never true    Within the past 12 months, the food you bought just didn't last and you didn't have money to get more: Never true  Transportation Needs: No Transportation Needs (07/24/2022)   Received from [facility] visits prior to 09/20/2022.   Transportation    In the past 12 months, has lack of reliable transportation kept you from medical appointments, meetings, work or from getting things needed for daily living?: No  Physical Activity: Insufficiently Active  (12/02/2021)   Received [facility] visits prior to 09/20/2022[facility] visits prior to 09/20/2022.   Exercise Vital Sign    Days of Exercise per Week: 3 days    Minutes of Exercise per Session: 30 min  Stress: No Stress Concern Present (12/02/2021)   [facility] prior to 09/20/2022., [facility] visits prior to 09/20/2022.   [employer] of Occupational Health - Occupational Stress Questionnaire    Feeling of Stress :  Not at all  Social Connections: Socially Integrated (12/02/2021)   Received from [facility] visits prior to 09/20/2022., [facility] visits prior to 09/20/2022.   Social Advertising account executive [NHANES]    Frequency of Communication with Friends and Family: More than three times a week    Frequency of Social Gatherings with Friends and Family: More than three times a week    Attends Religious Services: More than 4 times per year    Active Member of [employer] or Organizations: Yes    Attends Banker Meetings: More than 4 times per year    Marital Status: Married  Catering manager Violence: Not At Risk (12/02/2021)   [facility] prior to 09/20/2022., [facility] visits prior to 09/20/2022.   Humiliation, Afraid, Rape, and Kick questionnaire    Fear of Current or Ex-Partner: No    Emotionally Abused: No    Physically Abused: No    Sexually Abused: No    Review of Systems:    Constitutional: No weight loss, fever, chills, weakness or fatigue HEENT: Eyes: No change in vision               Ears, Nose, Throat:  No change in hearing or congestion Skin: No rash or itching Cardiovascular: No chest pain, chest pressure or palpitations   Respiratory: No SOB or cough Gastrointestinal: See HPI and otherwise  negative Genitourinary: No dysuria or change in urinary frequency Neurological: No headache, dizziness or syncope Musculoskeletal: No new muscle or joint pain Hematologic: No bleeding or bruising Psychiatric: No history of depression or anxiety    Physical Exam:  Vital signs: There were no vitals taken for this visit.  Constitutional: NAD, Well developed, Well nourished, alert and cooperative Head:  Normocephalic and atraumatic. Eyes:   PEERL, EOMI. No icterus. Conjunctiva pink. Respiratory: Respirations even and unlabored. Lungs clear to auscultation bilaterally.   No wheezes, crackles, or rhonchi.  Cardiovascular:  Regular rate and rhythm. No peripheral edema, cyanosis or pallor.  Gastrointestinal:  Soft, nondistended, nontender. No rebound or guarding. Normal bowel sounds. No appreciable masses or hepatomegaly. Rectal:  Not performed.  Msk:  Symmetrical without gross deformities. Without edema, no deformity or joint abnormality.  Neurologic:  Alert and  oriented x4;  grossly normal neurologically.  Skin:   Dry and intact without significant lesions or rashes. Psychiatric: Oriented to person, place and time. Demonstrates good judgement and reason without abnormal affect or behaviors.   RELEVANT LABS AND IMAGING: CBC    Component Value Date/Time   WBC 11.0 (H) 04/01/2023 1412   RBC 3.86 (L) 04/01/2023 1412   HGB 11.6 (L) 04/01/2023 1412   HGB 11.5 (L) 03/17/2017 1524   HCT 35.2 (L) 04/01/2023 1412   HCT 34.6 (L) 03/17/2017 1524   PLT 159.0 04/01/2023 1412   PLT 172 03/17/2017 1524   MCV 91.1 04/01/2023 1412   MCV 90.9 03/17/2017 1524   MCH 30.6 03/01/2018 1026   MCHC 33.0 04/01/2023 1412   RDW 13.8 04/01/2023 1412   RDW 13.6 03/17/2017 1524   LYMPHSABS 3.0 04/01/2023 1412   LYMPHSABS 2.9 03/17/2017 1524   MONOABS 1.0 04/01/2023 1412   MONOABS 0.6 03/17/2017 1524   EOSABS 0.1 04/01/2023 1412   EOSABS 0.1 03/17/2017 1524   BASOSABS 0.1 04/01/2023 1412   BASOSABS 0.1  03/17/2017 1524    CMP     Component Value Date/Time  NA 134 (L) 04/01/2023 1412   NA 139 03/17/2017 1524   K 4.1 04/01/2023 1412   K 4.2 03/17/2017 1524   CL 103 04/01/2023 1412   CO2 24 04/01/2023 1412   CO2 23 03/17/2017 1524   GLUCOSE 100 (H) 04/01/2023 1412   GLUCOSE 102 03/17/2017 1524   BUN 20 04/01/2023 1412   BUN 28.6 (H) 03/17/2017 1524   CREATININE 1.01 04/01/2023 1412   CREATININE 0.9 03/17/2017 1524   CALCIUM 9.4 04/01/2023 1412   CALCIUM 9.8 03/17/2017 1524   PROT 7.0 03/01/2018 1026   PROT 6.8 03/17/2017 1524   ALBUMIN 3.6 03/01/2018 1026   ALBUMIN 3.8 03/17/2017 1524   AST 16 03/01/2018 1026   AST 15 03/17/2017 1524   ALT 17 03/01/2018 1026   ALT 16 03/17/2017 1524   ALKPHOS 90 03/01/2018 1026   ALKPHOS 88 03/17/2017 1524   BILITOT 0.4 03/01/2018 1026   BILITOT 0.31 03/17/2017 1524   GFRNONAA 54 (L) 03/01/2018 1026   GFRAA >60 03/01/2018 1026     Assessment/Plan:   78 year old female history of diverticulitis 04/03/2023 treated with Augmentin x 10 days with clinical improvement at this time.  Up-to-date on colonoscopy within the last year.  History of diverticulitis - Discussed diverticulosis and diverticulitis and provided patient education handout -  No ongoing symptoms related to diverticulitis.  -Reviewed recommendations to follow a high fiber diet or to use fiber supplements on a regular basis.   -Using NSAIDs may be associated with a moderately increased risk of occurrence of any episode of diverticulitis and complicated diverticulitis. - Can follow-up on an as needed basis if recurrence or worsening of symptoms.  Lara Mulch [facility] 04/13/2023, 11:20 AM  Cc: Cheron Schaumann.,*

## 2023-04-13 NOTE — Telephone Encounter (Signed)
Patient seen by our provider, Boone Master, PA-C regarding her GI concerns today.

## 2023-04-15 ENCOUNTER — Inpatient Hospital Stay: Payer: PPO | Attending: Hematology and Oncology | Admitting: Hematology and Oncology

## 2023-04-15 DIAGNOSIS — Z170 Estrogen receptor positive status [ER+]: Secondary | ICD-10-CM

## 2023-04-15 DIAGNOSIS — C50811 Malignant neoplasm of overlapping sites of right female breast: Secondary | ICD-10-CM

## 2023-04-15 NOTE — Progress Notes (Signed)
[facility]  Telephone:([phone]) [phone] Fax:(336) [phone]     ID: Diane Macias DOB: [birth year]  MR#: [id number]  ZHY#:[id number]  Patient Care Team: Diane Macias., MD as PCP - General (Internal Medicine) Diane Neighbours, MD as Referring Physician (Surgery) Diane Macias, Diane Hue, MD (Inactive) as Consulting Physician (Oncology) Diane Hare, MD as Referring Physician (Radiation Oncology) Diane Form, MD as Consulting Physician (Gastroenterology)   CHIEF COMPLAINT: Estrogen receptor positive lobular breast cancer   CURRENT TREATMENT: Surveillance.  INTERVAL HISTORY:  Diane Macias returns today for follow up of her estrogen receptor positive lobular breast cancer.  This was a follow-up telephone visit because she was complaining of some indigestion and abdominal pain.  She tells me that since her last visit here, she was diagnosed with diverticulitis and just completed antibiotics.  Her abdominal symptoms have completely resolved.  She also had a follow-up with gastroenterology.  She feels much better.  She had no other complaints today for me.  REVIEW OF SYSTEMS:   A detailed review of systems today is benign except as noted above   COVID 19 VACCINATION STATUS: Status post Pfizer x2+ booster September 2021   HISTORY OF CURRENT ILLNESS: From the original intake note:  Diane Macias had bilateral screening mammography at [facility] 11/30/2014 showing the breast density to be category B. There was a possible mass in the right breast. On 12/10/2016 she underwent right diagnostic mammography with tomography and right breast ultrasonography. This confirmed a 0.4 cm spiculated mass in the periareolar area of the right breast. Ultrasound showed a small irregular hypoechoic mass at the 9:00 position 1 cm from the nipple, measuring 0.5 cm. Ultrasound of the right axilla was benign.  Biopsy of the right breast area in question 12/25/2016 showed an invasive lobular  carcinoma, grade 2, estrogen receptor positive at 85%, progesterone receptor positive at 65%, both with strong staining intensity, and HER-2 not amplified by immunohistochemistry (1+).   On 01/19/2017 she underwent bilateral breast MRIs which showed in addition to the known right breast mass an additional area of concern in the central right breast. This measured 0.6 cm. MRI guided biopsy 02/06/2017 confirmed that this second area also was invasive lobular carcinoma, grade 2. It was located (by MRI) 1.5 cm inferior medial to the previously biopsied mass. Also there was a 0.6 mm sternal lesion in the inferior aspect of the sternum.  Because of the sternal lesion on the patient underwent PET scanning 02/12/2017. This was entirely negative and the sternal lesion is felt to be most consistent with a bone island  On 02/20/2017 the patient underwent right lumpectomy. This showed ([id number]) invasive lobular carcinoma measuring 0.5 cm, grade 2, with evidence of lobular carcinoma in situ. Margins were clear. Both sentinel lymph nodes were clear. [NB: the second biopsied lesion was present in the surgical sample but not addressed in the report; I have discussed this with pathology and Diane Macias has kindly agreed to review the case, measure this lesion and margins, and dictate an addendum]  The patient's subsequent history is as detailed below.   PAST MEDICAL HISTORY: Past Medical History:  Diagnosis Date   Adenomatous colon polyp 04/18/2011   Arthritis    bilateral knees/RIGHT shoulder   Breast cancer (HCC) 2018   sx/radiation   Cancer (HCC) 1997   squamous and basal cell   Cataract    bilateral sx   Diabetes mellitus    type II-on meds   Hyperlipidemia    on meds  Hypertension    on meds   Osteopenia    Seasonal allergies    Sleep apnea    uses CPAP    PAST SURGICAL HISTORY: Past Surgical History:  Procedure Laterality Date   arthroscopic knee Right 01/2008   knee   CATARACT  EXTRACTION Bilateral 2016   CHOLECYSTECTOMY  1981   COLONOSCOPY  2018   KN-suprep(good)-tics/hems/TA/SSA   DILATION AND CURETTAGE OF UTERUS     FOOT SURGERY Right 07/2007   foot (BUNION)   KNEE ARTHROSCOPY Left    POLYPECTOMY  2018   TA/SSA   TONSILLECTOMY  1953   torn maniscus  11/2010   left knee   VAGINAL HYSTERECTOMY  1994    FAMILY HISTORY Family History  Problem Relation Age of Onset   Lung cancer Father 37   Colon polyps Sister    Colon polyps Sister    Colon cancer Neg Hx    Esophageal cancer Neg Hx    Rectal cancer Neg Hx    Stomach cancer Neg Hx   The patient's father died from lung cancer in the setting of tobacco abuse. He was 77 years old. The patient's mother died at 36 with Alzheimer's disease. The patient had no brothers, 2 sisters. One sister was diagnosed with breast cancer at age 53. There is also a maternal cousin with breast cancer. There is no history of ovarian or prostate cancer in the family. The patient was genetically tested at [facility] and reportedly carries a heterozygous MutYH mutation. I do have requested that report   GYNECOLOGIC HISTORY:  No LMP recorded. Patient has had a hysterectomy. Menarche age 22, first live birth age 74, the patient is GX P2. She underwent total abdominal hysterectomy with bilateral salpingo-oophorectomy approximately age 81. She took hormone replacement for approximately 17 years.   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 Daughter Diane Macias is a Set designer in [place]. Daughter Diane Macias is Environmental health practitioner at the country day school locally. The patient has 3 grandchildren. She is a International aid/development worker.    ADVANCED DIRECTIVES:    HEALTH MAINTENANCE: Social History   Tobacco Use   Smoking status: Former    Current packs/day: 0.00    Types: Cigarettes    Quit date: 07/21/1968    Years since quitting: 54.7   Smokeless tobacco: Never  Vaping Use    Vaping status: Never Used  Substance Use Topics   Alcohol use: Yes    Comment: rarely wine   Drug use: No     Colonoscopy: May 2018; Nandigam  PAP:  Bone density:   Allergies  Allergen Reactions   Ciprofloxacin Swelling and Rash   Flagyl [Metronidazole] Swelling   Cefdinir Diarrhea   Celecoxib Swelling   Diazepam Other (See Comments)    Dpression/Make patient restless  Make patient restless   Hydrocodone Other (See Comments)    Could not sleep   Hydrocodone-Acetaminophen Other (See Comments)    restlessness   Oxycodone Other (See Comments)    Restlessness  Restless   Sulfa Antibiotics Rash   Sulfonamide Derivatives Rash    Current Outpatient Medications  Medication Sig Dispense Refill   amoxicillin-clavulanate (AUGMENTIN) 875-125 MG tablet Take 1 tablet by mouth 2 (two) times daily. 20 tablet 0   cholecalciferol (VITAMIN D) 1000 UNITS tablet Take 1,000 Units by mouth daily.       glucose blood test strip 1 each by Other route as needed  for other. Use as instructed     lisinopril (ZESTRIL) 40 MG tablet Take 40 mg by mouth daily.     metFORMIN (GLUCOPHAGE) 500 MG tablet Take 500 mg by mouth daily after supper.       MYRBETRIQ 50 MG TB24 tablet Take 50 mg by mouth daily.     ONE TOUCH LANCETS MISC by Does not apply route.     Probiotic Product (ALIGN PO) Take 1 capsule by mouth daily.     rosuvastatin (CRESTOR) 5 MG tablet Take 1 tablet (5 mg total) by mouth daily.     vitamin B-12 (CYANOCOBALAMIN) 1000 MCG tablet Take 1,000 mcg by mouth daily.     Current Facility-Administered Medications  Medication Dose Route Frequency Provider Last Rate Last Admin   0.9 %  sodium chloride infusion  500 mL Intravenous Once Nandigam, Eleonore Chiquito, MD        OBJECTIVE: White woman in no acute distress  There were no vitals filed for this visit.      There is no height or weight on file to calculate BMI.   Wt Readings from Last 3 Encounters:  04/13/23 177 lb (80.3 kg)  03/16/23  179 lb 11.2 oz (81.5 kg)  04/01/22 177 lb (80.3 kg)     ECOG FS:1 - Symptomatic but completely ambulatory   Physical exam not done, telephone visit   LAB RESULTS:  CMP     Component Value Date/Time   NA 134 (L) 04/01/2023 1412   NA 139 03/17/2017 1524   K 4.1 04/01/2023 1412   K 4.2 03/17/2017 1524   CL 103 04/01/2023 1412   CO2 24 04/01/2023 1412   CO2 23 03/17/2017 1524   GLUCOSE 100 (H) 04/01/2023 1412   GLUCOSE 102 03/17/2017 1524   BUN 20 04/01/2023 1412   BUN 28.6 (H) 03/17/2017 1524   CREATININE 1.01 04/01/2023 1412   CREATININE 0.9 03/17/2017 1524   CALCIUM 9.4 04/01/2023 1412   CALCIUM 9.8 03/17/2017 1524   PROT 7.0 03/01/2018 1026   PROT 6.8 03/17/2017 1524   ALBUMIN 3.6 03/01/2018 1026   ALBUMIN 3.8 03/17/2017 1524   AST 16 03/01/2018 1026   AST 15 03/17/2017 1524   ALT 17 03/01/2018 1026   ALT 16 03/17/2017 1524   ALKPHOS 90 03/01/2018 1026   ALKPHOS 88 03/17/2017 1524   BILITOT 0.4 03/01/2018 1026   BILITOT 0.31 03/17/2017 1524   GFRNONAA 54 (L) 03/01/2018 1026   GFRAA >60 03/01/2018 1026    Lab Results  Component Value Date   WBC 11.0 (H) 04/01/2023   NEUTROABS 7.0 04/01/2023   HGB 11.6 (L) 04/01/2023   HCT 35.2 (L) 04/01/2023   MCV 91.1 04/01/2023   PLT 159.0 04/01/2023      Chemistry      Component Value Date/Time   NA 134 (L) 04/01/2023 1412   NA 139 03/17/2017 1524   K 4.1 04/01/2023 1412   K 4.2 03/17/2017 1524   CL 103 04/01/2023 1412   CO2 24 04/01/2023 1412   CO2 23 03/17/2017 1524   BUN 20 04/01/2023 1412   BUN 28.6 (H) 03/17/2017 1524   CREATININE 1.01 04/01/2023 1412   CREATININE 0.9 03/17/2017 1524      Component Value Date/Time   CALCIUM 9.4 04/01/2023 1412   CALCIUM 9.8 03/17/2017 1524   ALKPHOS 90 03/01/2018 1026   ALKPHOS 88 03/17/2017 1524   AST 16 03/01/2018 1026   AST 15 03/17/2017 1524   ALT 17 03/01/2018  1026   ALT 16 03/17/2017 1524   BILITOT 0.4 03/01/2018 1026   BILITOT 0.31 03/17/2017 1524        No results found for: "LABCA2"  No components found for: "WGNFAO130"  No results for input(s): "INR" in the last 168 hours.  No results found for: "LABCA2"  No results found for: "QMV784"  No results found for: "CAN125"  No results found for: "CAN153"  No results found for: "CA2729"  No components found for: "HGQUANT"  No results found for: "CEA1", "CEA" / No results found for: "CEA1", "CEA"   No results found for: "AFPTUMOR"  No results found for: "CHROMOGRNA"  No results found for: "TOTALPROTELP", "ALBUMINELP", "A1GS", "A2GS", "BETS", "BETA2SER", "GAMS", "MSPIKE", "SPEI" (this displays SPEP labs)  No results found for: "KPAFRELGTCHN", "LAMBDASER", "KAPLAMBRATIO" (kappa/lambda light chains)  No results found for: "HGBA", "HGBA2QUANT", "HGBFQUANT", "HGBSQUAN" (Hemoglobinopathy evaluation)   No results found for: "LDH"  No results found for: "IRON", "TIBC", "IRONPCTSAT" (Iron and TIBC)  No results found for: "FERRITIN"  Urinalysis No results found for: "COLORURINE", "APPEARANCEUR", "LABSPEC", "PHURINE", "GLUCOSEU", "HGBUR", "BILIRUBINUR", "KETONESUR", "PROTEINUR", "UROBILINOGEN", "NITRITE", "LEUKOCYTESUR"   STUDIES: CT ABDOMEN PELVIS W CONTRAST  Result Date: 04/02/2023 CLINICAL DATA:  Diverticulitis EXAM: CT ABDOMEN AND PELVIS WITH CONTRAST TECHNIQUE: Multidetector CT imaging of the abdomen and pelvis was performed using the standard protocol following bolus administration of intravenous contrast. RADIATION DOSE REDUCTION: This exam was performed according to the departmental dose-optimization program which includes automated exposure control, adjustment of the mA and/or kV according to patient size and/or use of iterative reconstruction technique. CONTRAST:  OMNIPAQUE IOHEXOL 300 MG/ML  SOLN COMPARISON:  None Available. FINDINGS: Lower chest: The lung bases are clear. The imaged heart is unremarkable. Hepatobiliary: The liver is unremarkable. The  gallbladder is surgically absent. There is no biliary ductal dilatation. Pancreas: Unremarkable. Spleen: Unremarkable. Adrenals/Urinary Tract: The adrenals are unremarkable. Subcentimeter hypodense lesions in the kidneys likely reflect benign cysts requiring no specific imaging follow-up. There are no suspicious renal lesions. There are no stones in either kidney or along the course of either ureter. There is no hydronephrosis or hydroureter. The bladder is unremarkable. There is symmetric excretion of contrast into the collecting systems on the delayed images. Stomach/Bowel: The stomach is unremarkable. There is no evidence of bowel obstruction. There is colonic diverticulosis with and approximately 6 cm segment of descending colon which demonstrates significant wall thickening and surrounding fat stranding consistent with acute diverticulitis. There is no evidence of perforation or abscess. Vascular/Lymphatic: There is mild calcified plaque in the nonaneurysmal abdominal aorta. The major branch vessels are patent. The main portal and splenic veins are patent. There is no abdominal or pelvic lymphadenopathy. Reproductive: The uterus is surgically absent. There is no adnexal mass. Other: There is no ascites or free air. Musculoskeletal: There is mild compression deformity of the L3 vertebral body which is age-indeterminate but appears chronic. There is moderate to severe facet arthropathy throughout the lumbar spine with grade 1 anterolisthesis of L4 on L5. IMPRESSION: 1. Acute uncomplicated descending colonic diverticulitis. No evidence of perforation or abscess. 2. Age-indeterminate but favored chronic mild compression deformity of the L3 vertebral body. Electronically Signed   By: Lesia Hausen M.D.   On: 04/02/2023 13:09     ELIGIBLE FOR AVAILABLE RESEARCH PROTOCOL: no  ASSESSMENT: 77 y.o. [place], [place] woman status post right lumpectomy 02/20/2017 for 2 separate areas of invasive lobular carcinoma, grade  2, only one addressed in the pathology report, that one being  pT1a pN0, stage IA, estrogen and progesterone receptor strongly positive, HER-2 not amplified  (a) the second area of known invasive lobular carcinoma was also included in the surgical sample; its dimensions and margins were to be addressed in a pathology addendum  (1) Oncotype not indicated and no chemotherapy is warranted  (2) adjuvant radiation to the right breast completed 05/25/2017  (3) exemestane started 07/27/2017, switched to exemestane April 2019  (a) bone density at [facility] 11/27/2015 shows a T score of +0.4  (b) repeat bone density 04/26/2018 shows a T score of -1.0   (4) per patient, she carries a heterozygous MutYH mutation   PLAN: She took anti estrogen for about 4-1/2 years, quit in August 2022.  Physical examination today without any concern for breast cancer recurrence Scheduled mammogram Nov 2024.  She had a mammogram in May for some right breast concerns.  These were thought to be postlumpectomy changes. With regards to her gastrointestinal symptoms, she has been treated for acute diverticulitis and since then her abdominal pain, indigestion have resolved and she feels much better.  I encouraged her to call us if she has any new or worsening symptoms.  She can otherwise return to clinic in 1 year.  Total encounter time 30 minutes.*  *Total Encounter Time as defined by the Centers for Medicare and Medicaid Services includes, in addition to the face-to-face time of a patient visit (documented in the note above) non-face-to-face time: obtaining and reviewing outside history, ordering and reviewing medications, tests or procedures, care coordination (communications with other health care professionals or caregivers) and documentation in the medical record.

## 2023-10-01 ENCOUNTER — Emergency Department (HOSPITAL_BASED_OUTPATIENT_CLINIC_OR_DEPARTMENT_OTHER)
Admission: EM | Admit: 2023-10-01 | Discharge: 2023-10-01 | Disposition: A | Discharge status: Home / Self Care | Attending: Emergency Medicine | Admitting: Emergency Medicine

## 2023-10-01 ENCOUNTER — Emergency Department (HOSPITAL_BASED_OUTPATIENT_CLINIC_OR_DEPARTMENT_OTHER)

## 2023-10-01 ENCOUNTER — Encounter (HOSPITAL_BASED_OUTPATIENT_CLINIC_OR_DEPARTMENT_OTHER): Payer: Self-pay | Admitting: Emergency Medicine

## 2023-10-01 ENCOUNTER — Other Ambulatory Visit: Payer: Self-pay

## 2023-10-01 DIAGNOSIS — W01198A Fall on same level from slipping, tripping and stumbling with subsequent striking against other object, initial encounter: Secondary | ICD-10-CM | POA: Insufficient documentation

## 2023-10-01 DIAGNOSIS — S46911A Strain of unspecified muscle, fascia and tendon at shoulder and upper arm level, right arm, initial encounter: Secondary | ICD-10-CM

## 2023-10-01 DIAGNOSIS — W19XXXA Unspecified fall, initial encounter: Secondary | ICD-10-CM

## 2023-10-01 DIAGNOSIS — S61511A Laceration without foreign body of right wrist, initial encounter: Secondary | ICD-10-CM | POA: Diagnosis present

## 2023-10-01 DIAGNOSIS — S46011A Strain of muscle(s) and tendon(s) of the rotator cuff of right shoulder, initial encounter: Secondary | ICD-10-CM | POA: Insufficient documentation

## 2023-10-01 NOTE — ED Provider Notes (Signed)
 [place] [facility] Provider Note   CSN: [id number] Arrival date & time: 10/01/23  1623     History  Chief Complaint  Patient presents with   Fall   Laceration    Diane Macias is a 78 y.o. female.  Patient status post fall at home fell into a door jam.  Occurred around 1600.  Was inside.  Patient thinks her tetanus is up-to-date.  But not high risk wound.  Patient on any blood thinners no loss of conscious no head injury.  Complaint of pain to her right shoulder but really what alarmed her was a skin tear that occurred on her right wrist.  No lower extremity problems no neck problems no back problems.  Past medical history for diabetes hyperlipidemia hypertension breast cancer in 2018 with radiation treatment.  Past surgical history significant for vaginal hysterectomy gallbladder removal.  Former smoker quit in 1970.       Home Medications Prior to Admission medications   Medication Sig Start Date End Date Taking? Authorizing Provider  amoxicillin-clavulanate (AUGMENTIN) 875-125 MG tablet Take 1 tablet by mouth 2 (two) times daily. 04/01/23   Napoleon Form, MD  cholecalciferol (VITAMIN D) 1000 UNITS tablet Take 1,000 Units by mouth daily.      [provider]  glucose blood test strip 1 each by Other route as needed for other. Use as instructed    [provider]  lisinopril (ZESTRIL) 40 MG tablet Take 40 mg by mouth daily.    [provider]  metFORMIN (GLUCOPHAGE) 500 MG tablet Take 500 mg by mouth daily after supper.      [provider]  MYRBETRIQ 50 MG TB24 tablet Take 50 mg by mouth daily.    [provider]  ONE TOUCH LANCETS MISC by Does not apply route.    [provider]  Probiotic Product (ALIGN PO) Take 1 capsule by mouth daily.    [provider]  rosuvastatin (CRESTOR) 5 MG tablet Take 1 tablet (5 mg total) by mouth daily. 06/07/20   Magrinat, Valentino Hue, MD   vitamin B-12 (CYANOCOBALAMIN) 1000 MCG tablet Take 1,000 mcg by mouth daily.    [provider]      Allergies    Ciprofloxacin, Flagyl [metronidazole], Cefdinir, Celecoxib, Diazepam, Hydrocodone, Hydrocodone-acetaminophen, Oxycodone, Sulfa antibiotics, and Sulfonamide derivatives    Review of Systems   Review of Systems  Constitutional:  Negative for chills and fever.  HENT:  Negative for ear pain and sore throat.   Eyes:  Negative for pain and visual disturbance.  Respiratory:  Negative for cough and shortness of breath.   Cardiovascular:  Negative for chest pain and palpitations.  Gastrointestinal:  Negative for abdominal pain and vomiting.  Genitourinary:  Negative for dysuria and hematuria.  Musculoskeletal:  Negative for arthralgias, back pain and neck pain.  Skin:  Positive for wound. Negative for color change and rash.  Neurological:  Negative for dizziness, seizures, syncope, speech difficulty, weakness, light-headedness and headaches.  All other systems reviewed and are negative.   Physical Exam Updated Vital Signs BP (!) 164/70 (BP Location: Left Arm)   Pulse (!) 59   Temp 97.7 F (36.5 C) (Oral)   Resp 18   Ht 1.549 m (5\' 1" )   Wt 78 kg   SpO2 100%   BMI 32.50 kg/m  Physical Exam Vitals and nursing note reviewed.  Constitutional:      General: She is not in acute distress.  Appearance: Normal appearance. She is well-developed. She is not ill-appearing.  HENT:     Head: Normocephalic and atraumatic.     Mouth/Throat:     Mouth: Mucous membranes are moist.  Eyes:     Extraocular Movements: Extraocular movements intact.     Conjunctiva/sclera: Conjunctivae normal.     Pupils: Pupils are equal, round, and reactive to light.  Cardiovascular:     Rate and Rhythm: Normal rate and regular rhythm.     Heart sounds: No murmur heard. Pulmonary:     Effort: Pulmonary effort is normal. No respiratory distress.     Breath sounds: Normal breath sounds.   Abdominal:     Palpations: Abdomen is soft.     Tenderness: There is no abdominal tenderness.  Musculoskeletal:        General: Tenderness and signs of injury present. No swelling or deformity.     Cervical back: Normal range of motion and neck supple. No rigidity.     Right lower leg: No edema.     Left lower leg: No edema.     Comments: Full range of motion at the right shoulder.  No obvious deformity.  Full range of motion at the right elbow.  Full range of motion of the right wrist no snuffbox tenderness.  Good movement of fingers.  There is a skin tear on the ulnar side of the right wrist.  Measuring about 4 cm.  All skin back in place.  No active bleeding.  Radial pulses 2+.  Sensation intact.  Skin:    General: Skin is warm and dry.     Capillary Refill: Capillary refill takes less than 2 seconds.  Neurological:     General: No focal deficit present.     Mental Status: She is alert and oriented to person, place, and time.  Psychiatric:        Mood and Affect: Mood normal.     ED Results / Procedures / Treatments   Labs (all labs ordered are listed, but only abnormal results are displayed) Labs Reviewed - No data to display  EKG None  Radiology DG Shoulder Right Result Date: 10/01/2023 CLINICAL DATA:  Pain after fall. EXAM: RIGHT SHOULDER - 2+ VIEW COMPARISON:  None Available. FINDINGS: There is no evidence of fracture or dislocation. Mild acromioclavicular degenerative spurring. Soft tissues are unremarkable. IMPRESSION: 1. No fracture or dislocation of the right shoulder. 2. Mild acromioclavicular degenerative spurring. Electronically Signed   By: Narda Rutherford M.D.   On: 10/01/2023 17:55   DG Wrist Complete Right Result Date: 10/01/2023 CLINICAL DATA:  Pain after fall. EXAM: RIGHT WRIST - COMPLETE 3+ VIEW COMPARISON:  None Available. FINDINGS: There is no evidence of fracture or dislocation. Osteoarthritis of the thumb carpal metacarpal joint with fragmented  osteophytes, joint space narrowing and subchondral changes. Overlying dressing in place. IMPRESSION: 1. No fracture or dislocation of the right wrist. 2. Osteoarthritis of the thumb carpometacarpal joint. Electronically Signed   By: Narda Rutherford M.D.   On: 10/01/2023 17:48    Procedures Procedures    Medications Ordered in ED Medications - No data to display  ED Course/ Medical Decision Making/ A&P                                 Medical Decision Making Amount and/or Complexity of Data Reviewed Radiology: ordered.   X-ray of the right shoulder without any acute injury.  X-ray  of the right wrist without any acute injury.  There is some osteoarthritis of the thumb.  Clinically no snuffbox tenderness.  Skin tear to the right wrist area can be treated with wound care.  Will have nurse apply antibiotic ointment nonadherent dressing and then wrapped.   Final Clinical Impression(s) / ED Diagnoses Final diagnoses:  Fall, initial encounter  Tear of skin of right wrist, initial encounter  Strain of right shoulder, initial encounter    Rx / DC Orders ED Discharge Orders     None         Vanetta Mulders, MD 10/03/23 1621

## 2023-10-01 NOTE — Discharge Instructions (Signed)
 Wound care to the skin tear antibiotic ointment nonadherent dressing and wrapped.  Leave that in place for 2 days.  You can then remove it wash gently with soap and water reapply antibiotic ointment and then a large Band-Aid.  Should heal quite well.  Expect to be very sore and stiff particularly in the right upper extremity perhaps for the next couple days.  Would recommend extra strength Tylenol 2 tablets every 8 hours as needed.  Follow-up with your doctor as needed.  Return for any new or worse symptoms.

## 2023-10-01 NOTE — ED Triage Notes (Signed)
 Pt POV steady gait-  Pt reports mechanical fall into door jam.  Denies blood thinners, denies LOC, denies head injury.  C/o R wrist laceration, R shoulder pain.

## 2024-03-14 ENCOUNTER — Telehealth: Payer: Self-pay

## 2024-03-14 NOTE — Telephone Encounter (Signed)
 Left message for patient advising of appointment on 8/26

## 2024-03-15 ENCOUNTER — Inpatient Hospital Stay: Payer: PPO | Attending: Hematology and Oncology | Admitting: Hematology and Oncology

## 2024-03-15 VITALS — BP 168/66 | HR 64 | Temp 98.4°F | Resp 14 | Wt 177.8 lb

## 2024-03-15 DIAGNOSIS — Z170 Estrogen receptor positive status [ER+]: Secondary | ICD-10-CM | POA: Diagnosis not present

## 2024-03-15 DIAGNOSIS — Z881 Allergy status to other antibiotic agents status: Secondary | ICD-10-CM | POA: Diagnosis not present

## 2024-03-15 DIAGNOSIS — M48061 Spinal stenosis, lumbar region without neurogenic claudication: Secondary | ICD-10-CM | POA: Diagnosis not present

## 2024-03-15 DIAGNOSIS — Z87891 Personal history of nicotine dependence: Secondary | ICD-10-CM | POA: Diagnosis not present

## 2024-03-15 DIAGNOSIS — Z83719 Family history of colon polyps, unspecified: Secondary | ICD-10-CM | POA: Insufficient documentation

## 2024-03-15 DIAGNOSIS — Z885 Allergy status to narcotic agent status: Secondary | ICD-10-CM | POA: Diagnosis not present

## 2024-03-15 DIAGNOSIS — Z90710 Acquired absence of both cervix and uterus: Secondary | ICD-10-CM | POA: Diagnosis not present

## 2024-03-15 DIAGNOSIS — Z1721 Progesterone receptor positive status: Secondary | ICD-10-CM | POA: Insufficient documentation

## 2024-03-15 DIAGNOSIS — Z9842 Cataract extraction status, left eye: Secondary | ICD-10-CM | POA: Insufficient documentation

## 2024-03-15 DIAGNOSIS — G4730 Sleep apnea, unspecified: Secondary | ICD-10-CM | POA: Insufficient documentation

## 2024-03-15 DIAGNOSIS — Z9181 History of falling: Secondary | ICD-10-CM | POA: Diagnosis not present

## 2024-03-15 DIAGNOSIS — Z882 Allergy status to sulfonamides status: Secondary | ICD-10-CM | POA: Diagnosis not present

## 2024-03-15 DIAGNOSIS — Z9049 Acquired absence of other specified parts of digestive tract: Secondary | ICD-10-CM | POA: Insufficient documentation

## 2024-03-15 DIAGNOSIS — C50811 Malignant neoplasm of overlapping sites of right female breast: Secondary | ICD-10-CM | POA: Diagnosis not present

## 2024-03-15 DIAGNOSIS — Z860101 Personal history of adenomatous and serrated colon polyps: Secondary | ICD-10-CM | POA: Insufficient documentation

## 2024-03-15 DIAGNOSIS — Z9841 Cataract extraction status, right eye: Secondary | ICD-10-CM | POA: Diagnosis not present

## 2024-03-15 DIAGNOSIS — Z801 Family history of malignant neoplasm of trachea, bronchus and lung: Secondary | ICD-10-CM | POA: Diagnosis not present

## 2024-03-15 DIAGNOSIS — Z79899 Other long term (current) drug therapy: Secondary | ICD-10-CM | POA: Insufficient documentation

## 2024-03-15 DIAGNOSIS — M170 Bilateral primary osteoarthritis of knee: Secondary | ICD-10-CM | POA: Diagnosis not present

## 2024-03-15 DIAGNOSIS — N6315 Unspecified lump in the right breast, overlapping quadrants: Secondary | ICD-10-CM | POA: Insufficient documentation

## 2024-03-15 DIAGNOSIS — Z90722 Acquired absence of ovaries, bilateral: Secondary | ICD-10-CM | POA: Insufficient documentation

## 2024-03-15 NOTE — Progress Notes (Signed)
 [facility]  [phone] [phone]     ID: Diane Macias DOB: [birth year]  MR#: [id number]  RDW#:[id number]  Patient Care Team: Diane Truman GRADE., MD as PCP - General (Internal Medicine) Diane Sensor, MD as Referring Physician (Surgery) Diane Hastings, MD as Referring Physician (Radiation Oncology) Diane Gustav GAILS, MD as Consulting Physician (Gastroenterology)   CHIEF COMPLAINT: Estrogen receptor positive lobular breast cancer   CURRENT TREATMENT: Surveillance.  INTERVAL HISTORY:  Discussed the use of AI scribe software for clinical note transcription with the patient, who gave verbal consent to proceed.  History of Present Illness Diane Macias is a 78 year old female who presents for a follow-up   In March, she experienced a fall, tripping and injuring her arm against a door jam, resulting in bleeding but no fracture. She was evaluated at [facility] in [facility].  She has significant back pain and has undergone diagnostic tests including x-rays, a PET scan, a DEXA scan, and a bone scan. She has been diagnosed with lumbar spinal stenosis and osteoporosis. She has been to pain management but has not received injections due to apprehension. She is taking calcium and vitamin D  supplements.  She has 'bone on bone' knees and is hesitant about knee replacement surgery due to her husband's recent surgeries and her own health issues.  She experiences morning raspiness in her throat, attributing it to possible allergies, and uses an air purifier near her bedroom.  She has a history of breast cancer and continues regular mammograms, with the last one in November 2024 at [facility]. No new changes in the breast area were reported, and mammogram results were satisfactory, showing only treatment changes in the right breast.    REVIEW OF SYSTEMS:   A detailed review of systems today is benign except as noted above   COVID 19  VACCINATION STATUS: Status post Pfizer x2+ booster September 2021   HISTORY OF CURRENT ILLNESS: From the original intake note:  Diane Macias had bilateral screening mammography at [facility] 11/30/2014 showing the breast density to be category B. There was a possible mass in the right breast. On 12/10/2016 she underwent right diagnostic mammography with tomography and right breast ultrasonography. This confirmed a 0.4 cm spiculated mass in the periareolar area of the right breast. Ultrasound showed a small irregular hypoechoic mass at the 9:00 position 1 cm from the nipple, measuring 0.5 cm. Ultrasound of the right axilla was benign.  Biopsy of the right breast area in question 12/25/2016 showed an invasive lobular carcinoma, grade 2, estrogen receptor positive at 85%, progesterone receptor positive at 65%, both with strong staining intensity, and HER-2 not amplified by immunohistochemistry (1+).   On 01/19/2017 she underwent bilateral breast MRIs which showed in addition to the known right breast mass an additional area of concern in the central right breast. This measured 0.6 cm. MRI guided biopsy 02/06/2017 confirmed that this second area also was invasive lobular carcinoma, grade 2. It was located (by MRI) 1.5 cm inferior medial to the previously biopsied mass. Also there was a 0.6 mm sternal lesion in the inferior aspect of the sternum.  Because of the sternal lesion on the patient underwent PET scanning 02/12/2017. This was entirely negative and the sternal lesion is felt to be most consistent with a bone island  On 02/20/2017 the patient underwent right lumpectomy. This showed ([id number]) invasive lobular carcinoma measuring 0.5 cm, grade 2, with evidence of lobular carcinoma in situ. Margins were clear. Both sentinel  lymph nodes were clear. [NB: the second biopsied lesion was present in the surgical sample but not addressed in the report; I have discussed this with pathology and Diane Macias has kindly  agreed to review the case, measure this lesion and margins, and dictate an addendum]  The patient's subsequent history is as detailed below.   PAST MEDICAL HISTORY: Past Medical History:  Diagnosis Date   Adenomatous colon polyp 04/18/2011   Arthritis    bilateral knees/RIGHT shoulder   Breast cancer (HCC) 2018   sx/radiation   Cancer (HCC) 1997   squamous and basal cell   Cataract    bilateral sx   Diabetes mellitus    type II-on meds   Hyperlipidemia    on meds   Hypertension    on meds   Osteopenia    Seasonal allergies    Sleep apnea    uses CPAP    PAST SURGICAL HISTORY: Past Surgical History:  Procedure Laterality Date   arthroscopic knee Right 01/2008   knee   CATARACT EXTRACTION Bilateral 2016   CHOLECYSTECTOMY  1981   COLONOSCOPY  2018   KN-suprep(good)-tics/hems/TA/SSA   DILATION AND CURETTAGE OF UTERUS     FOOT SURGERY Right 07/2007   foot (BUNION)   KNEE ARTHROSCOPY Left    POLYPECTOMY  2018   TA/SSA   TONSILLECTOMY  1953   torn maniscus  11/2010   left knee   VAGINAL HYSTERECTOMY  1994    FAMILY HISTORY Family History  Problem Relation Age of Onset   Lung cancer Father 52   Colon polyps Sister    Colon polyps Sister    Colon cancer Neg Hx    Esophageal cancer Neg Hx    Rectal cancer Neg Hx    Stomach cancer Neg Hx   The patient's father died from lung cancer in the setting of tobacco abuse. He was 78 years old. The patient's mother died at 60 with Alzheimer's disease. The patient had no brothers, 2 sisters. One sister was diagnosed with breast cancer at age 48. There is also a maternal cousin with breast cancer. There is no history of ovarian or prostate cancer in the family. The patient was genetically tested at [facility] and reportedly carries a heterozygous MutYH mutation. I do have requested that report   GYNECOLOGIC HISTORY:  No LMP recorded. Patient has had a hysterectomy. Menarche age 89, first live birth age 67, the patient is GX  P2. She underwent total abdominal hysterectomy with bilateral salpingo-oophorectomy approximately age 89. She took hormone replacement for approximately 17 years.   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 Zachary is a retired Market researcher. Daughter Pleasant is a Set designer in [place]. Daughter Rosina is Environmental health practitioner at the country day school locally. The patient has 3 grandchildren. She is a International aid/development worker.    ADVANCED DIRECTIVES:    HEALTH MAINTENANCE: Social History   Tobacco Use   Smoking status: Former    Current packs/day: 0.00    Types: Cigarettes    Quit date: 07/21/1968    Years since quitting: 55.6   Smokeless tobacco: Never  Vaping Use   Vaping status: Never Used  Substance Use Topics   Alcohol use: Yes    Comment: rarely wine   Drug use: No     Colonoscopy: May 2018; Nandigam  PAP:  Bone density:   Allergies  Allergen Reactions   Ciprofloxacin Swelling and Rash   Flagyl [Metronidazole] Swelling  Cefdinir Diarrhea   Celecoxib Swelling   Diazepam Other (See Comments)    Dpression/Make patient restless  Make patient restless   Hydrocodone Other (See Comments)    Could not sleep   Hydrocodone-Acetaminophen  Other (See Comments)    restlessness   Oxycodone Other (See Comments)    Restlessness  Restless   Sulfa Antibiotics Rash   Sulfonamide Derivatives Rash    Current Outpatient Medications  Medication Sig Dispense Refill   Calcium Carbonate-Vitamin D  600-5 MG-MCG CAPS Take 600 mg by mouth daily.     glucose blood test strip 1 each by Other route as needed for other. Use as instructed     lisinopril (ZESTRIL) 40 MG tablet Take 40 mg by mouth daily.     metFORMIN (GLUCOPHAGE) 500 MG tablet Take 500 mg by mouth daily after supper.       MYRBETRIQ 50 MG TB24 tablet Take 50 mg by mouth daily.     ONE TOUCH LANCETS MISC by Does not apply route.     rosuvastatin (CRESTOR) 5 MG tablet Take 1 tablet (5 mg total) by  mouth daily.     vitamin B-12 (CYANOCOBALAMIN) 1000 MCG tablet Take 1,000 mcg by mouth daily.     cholecalciferol (VITAMIN D ) 1000 UNITS tablet Take 1,000 Units by mouth daily.   (Patient not taking: Reported on 03/15/2024)     Current Facility-Administered Medications  Medication Dose Route Frequency Provider Last Rate Last Admin   0.9 %  sodium chloride  infusion  500 mL Intravenous Once Nandigam, Kavitha V, MD        OBJECTIVE: White woman in no acute distress  Vitals:   03/15/24 1001  BP: (!) 168/66  Pulse: 64  Resp: 14  Temp: 98.4 F (36.9 C)  SpO2: 98%        Body mass index is 33.6 kg/m.   Wt Readings from Last 3 Encounters:  03/15/24 177 lb 12.8 oz (80.6 kg)  10/01/23 172 lb (78 kg)  04/13/23 177 lb (80.3 kg)     ECOG FS:1 - Symptomatic but completely ambulatory   She appears well, no acute distress No cervical adenopathy Bilateral breasts inspected. No palpable masses. No regional adenopathy. No LE edema.   LAB RESULTS:  CMP     Component Value Date/Time   NA 134 (L) 04/01/2023 1412   NA 139 03/17/2017 1524   K 4.1 04/01/2023 1412   K 4.2 03/17/2017 1524   CL 103 04/01/2023 1412   CO2 24 04/01/2023 1412   CO2 23 03/17/2017 1524   GLUCOSE 100 (H) 04/01/2023 1412   GLUCOSE 102 03/17/2017 1524   BUN 20 04/01/2023 1412   BUN 28.6 (H) 03/17/2017 1524   CREATININE 1.01 04/01/2023 1412   CREATININE 0.9 03/17/2017 1524   CALCIUM 9.4 04/01/2023 1412   CALCIUM 9.8 03/17/2017 1524   PROT 7.0 03/01/2018 1026   PROT 6.8 03/17/2017 1524   ALBUMIN 3.6 03/01/2018 1026   ALBUMIN 3.8 03/17/2017 1524   AST 16 03/01/2018 1026   AST 15 03/17/2017 1524   ALT 17 03/01/2018 1026   ALT 16 03/17/2017 1524   ALKPHOS 90 03/01/2018 1026   ALKPHOS 88 03/17/2017 1524   BILITOT 0.4 03/01/2018 1026   BILITOT 0.31 03/17/2017 1524   GFRNONAA 54 (L) 03/01/2018 1026   GFRAA >60 03/01/2018 1026    Lab Results  Component Value Date   WBC 11.0 (H) 04/01/2023   NEUTROABS  7.0 04/01/2023   HGB 11.6 (L) 04/01/2023  HCT 35.2 (L) 04/01/2023   MCV 91.1 04/01/2023   PLT 159.0 04/01/2023      Chemistry      Component Value Date/Time   NA 134 (L) 04/01/2023 1412   NA 139 03/17/2017 1524   K 4.1 04/01/2023 1412   K 4.2 03/17/2017 1524   CL 103 04/01/2023 1412   CO2 24 04/01/2023 1412   CO2 23 03/17/2017 1524   BUN 20 04/01/2023 1412   BUN 28.6 (H) 03/17/2017 1524   CREATININE 1.01 04/01/2023 1412   CREATININE 0.9 03/17/2017 1524      Component Value Date/Time   CALCIUM 9.4 04/01/2023 1412   CALCIUM 9.8 03/17/2017 1524   ALKPHOS 90 03/01/2018 1026   ALKPHOS 88 03/17/2017 1524   AST 16 03/01/2018 1026   AST 15 03/17/2017 1524   ALT 17 03/01/2018 1026   ALT 16 03/17/2017 1524   BILITOT 0.4 03/01/2018 1026   BILITOT 0.31 03/17/2017 1524       No results found for: LABCA2  No components found for: [id number]  No results for input(s): INR in the last 168 hours.  No results found for: LABCA2  No results found for: RJW800  No results found for: CAN125  No results found for: CAN153  No results found for: CA2729  No components found for: HGQUANT  No results found for: CEA1, CEA / No results found for: CEA1, CEA   No results found for: AFPTUMOR  No results found for: CHROMOGRNA  No results found for: TOTALPROTELP, ALBUMINELP, A1GS, A2GS, BETS, BETA2SER, GAMS, MSPIKE, SPEI (this displays SPEP labs)  No results found for: KPAFRELGTCHN, LAMBDASER, KAPLAMBRATIO (kappa/lambda light chains)  No results found for: HGBA, HGBA2QUANT, HGBFQUANT, HGBSQUAN (Hemoglobinopathy evaluation)   No results found for: LDH  No results found for: IRON, TIBC, IRONPCTSAT (Iron and TIBC)  No results found for: FERRITIN  Urinalysis No results found for: COLORURINE, APPEARANCEUR, LABSPEC, PHURINE, GLUCOSEU, HGBUR, BILIRUBINUR, KETONESUR, PROTEINUR, UROBILINOGEN,  NITRITE, LEUKOCYTESUR   STUDIES: No results found.   ELIGIBLE FOR AVAILABLE RESEARCH PROTOCOL: no  ASSESSMENT: 78 y.o. [place], [place] woman status post right lumpectomy 02/20/2017 for 2 separate areas of invasive lobular carcinoma, grade 2, only one addressed in the pathology report, that one being pT1a pN0, stage IA, estrogen and progesterone receptor strongly positive, HER-2 not amplified  (a) the second area of known invasive lobular carcinoma was also included in the surgical sample; its dimensions and margins were to be addressed in a pathology addendum  (1) Oncotype not indicated and no chemotherapy is warranted  (2) adjuvant radiation to the right breast completed 05/25/2017  (3) exemestane  started 07/27/2017, switched to exemestane  April 2019  (a) bone density at [facility] 11/27/2015 shows a T score of +0.4  (b) repeat bone density 04/26/2018 shows a T score of -1.0   (4) per patient, she carries a heterozygous MutYH mutation   PLAN: Assessment and Plan Assessment & Plan History of right breast cancer, status post treatment, in remission Seven years post-diagnosis, in remission. Mammogram showed treatment changes but no concerning findings. Breast density favorable. - Schedule follow-up appointment in one year. - Continue annual mammograms.  Osteoporosis Diagnosed with osteoporosis. Reclast recommended but not started due to apprehension. Recommended treatment.  Lumbar spinal stenosis and osteoarthritis of lumbar spine Chronic lumbar spinal stenosis and osteoarthritis causing significant back pain. Imaging and pain management consultation completed. Injections recommended but not yet accepted.  Bilateral knee osteoarthritis Bilateral knee osteoarthritis with bone-on-bone changes. Knee replacement surgery recommended  but deferred. Focus on managing back pain first. - Consider knee replacement surgery in the future if she consents.  Isolated systolic  hypertension Isolated systolic hypertension likely due to age-related vascular changes. Blood pressure remains elevated. Advised to monitor blood pressure at home. - Monitor blood pressure at home.  History of fall, March 2024 Fall in March 2024 resulted in arm injury but no fracture.  Morning throat hoarseness, suspected allergy Morning throat hoarseness possibly due to allergies.  - Consider placing an air purifier in the bedroom.  Total encounter time 30 minutes.*  *Total Encounter Time as defined by the Centers for Medicare and Medicaid Services includes, in addition to the face-to-face time of a patient visit (documented in the note above) non-face-to-face time: obtaining and reviewing outside history, ordering and reviewing medications, tests or procedures, care coordination (communications with other health care professionals or caregivers) and documentation in the medical record.

## 2024-03-15 NOTE — Progress Notes (Signed)
 [facility]  [phone] [phone]     ID: Diane Macias DOB: [birth year]  MR#: [id number]  RDW#:[id number]  Patient Care Team: Andrew Truman GRADE., MD as PCP - General (Internal Medicine) Caleb Sensor, MD as Referring Physician (Surgery) Keenan Hastings, MD as Referring Physician (Radiation Oncology) Shila Gustav GAILS, MD as Consulting Physician (Gastroenterology)   CHIEF COMPLAINT: Estrogen receptor positive lobular breast cancer   CURRENT TREATMENT: Surveillance.  INTERVAL HISTORY:  Diane Macias returns today for follow up of her estrogen receptor positive lobular breast cancer.  This was a follow-up telephone visit because she was complaining of some indigestion and abdominal pain.  She tells me that since her last visit here, she was diagnosed with diverticulitis and just completed antibiotics.  Her abdominal symptoms have completely resolved.  She also had a follow-up with gastroenterology.  She feels much better.  She had no other complaints today for me.  REVIEW OF SYSTEMS:   A detailed review of systems today is benign except as noted above   COVID 19 VACCINATION STATUS: Status post Pfizer x2+ booster September 2021   HISTORY OF CURRENT ILLNESS: From the original intake note:  Diane Macias had bilateral screening mammography at [facility] 11/30/2014 showing the breast density to be category B. There was a possible mass in the right breast. On 12/10/2016 she underwent right diagnostic mammography with tomography and right breast ultrasonography. This confirmed a 0.4 cm spiculated mass in the periareolar area of the right breast. Ultrasound showed a small irregular hypoechoic mass at the 9:00 position 1 cm from the nipple, measuring 0.5 cm. Ultrasound of the right axilla was benign.  Biopsy of the right breast area in question 12/25/2016 showed an invasive lobular carcinoma, grade 2, estrogen receptor positive at 85%, progesterone receptor  positive at 65%, both with strong staining intensity, and HER-2 not amplified by immunohistochemistry (1+).   On 01/19/2017 she underwent bilateral breast MRIs which showed in addition to the known right breast mass an additional area of concern in the central right breast. This measured 0.6 cm. MRI guided biopsy 02/06/2017 confirmed that this second area also was invasive lobular carcinoma, grade 2. It was located (by MRI) 1.5 cm inferior medial to the previously biopsied mass. Also there was a 0.6 mm sternal lesion in the inferior aspect of the sternum.  Because of the sternal lesion on the patient underwent PET scanning 02/12/2017. This was entirely negative and the sternal lesion is felt to be most consistent with a bone island  On 02/20/2017 the patient underwent right lumpectomy. This showed ([id number]) invasive lobular carcinoma measuring 0.5 cm, grade 2, with evidence of lobular carcinoma in situ. Margins were clear. Both sentinel lymph nodes were clear. [NB: the second biopsied lesion was present in the surgical sample but not addressed in the report; I have discussed this with pathology and Dr Wende has kindly agreed to review the case, measure this lesion and margins, and dictate an addendum]  The patient's subsequent history is as detailed below.   PAST MEDICAL HISTORY: Past Medical History:  Diagnosis Date   Adenomatous colon polyp 04/18/2011   Arthritis    bilateral knees/RIGHT shoulder   Breast cancer (HCC) 2018   sx/radiation   Cancer (HCC) 1997   squamous and basal cell   Cataract    bilateral sx   Diabetes mellitus    type II-on meds   Hyperlipidemia    on meds   Hypertension    on meds   Osteopenia  Seasonal allergies    Sleep apnea    uses CPAP    PAST SURGICAL HISTORY: Past Surgical History:  Procedure Laterality Date   arthroscopic knee Right 01/2008   knee   CATARACT EXTRACTION Bilateral 2016   CHOLECYSTECTOMY  1981   COLONOSCOPY  2018    KN-suprep(good)-tics/hems/TA/SSA   DILATION AND CURETTAGE OF UTERUS     FOOT SURGERY Right 07/2007   foot (BUNION)   KNEE ARTHROSCOPY Left    POLYPECTOMY  2018   TA/SSA   TONSILLECTOMY  1953   torn maniscus  11/2010   left knee   VAGINAL HYSTERECTOMY  1994    FAMILY HISTORY Family History  Problem Relation Age of Onset   Lung cancer Father 68   Colon polyps Sister    Colon polyps Sister    Colon cancer Neg Hx    Esophageal cancer Neg Hx    Rectal cancer Neg Hx    Stomach cancer Neg Hx   The patient's father died from lung cancer in the setting of tobacco abuse. He was 78 years old. The patient's mother died at 61 with Alzheimer's disease. The patient had no brothers, 2 sisters. One sister was diagnosed with breast cancer at age 48. There is also a maternal cousin with breast cancer. There is no history of ovarian or prostate cancer in the family. The patient was genetically tested at [facility] and reportedly carries a heterozygous MutYH mutation. I do have requested that report   GYNECOLOGIC HISTORY:  No LMP recorded. Patient has had a hysterectomy. Menarche age 3, first live birth age 79, the patient is GX P2. She underwent total abdominal hysterectomy with bilateral salpingo-oophorectomy approximately age 81. She took hormone replacement for approximately 17 years.   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 Zachary is a retired Market researcher. Daughter Pleasant is a Set designer in [place]. Daughter Rosina is Environmental health practitioner at the country day school locally. The patient has 3 grandchildren. She is a International aid/development worker.    ADVANCED DIRECTIVES:    HEALTH MAINTENANCE: Social History   Tobacco Use   Smoking status: Former    Current packs/day: 0.00    Types: Cigarettes    Quit date: 07/21/1968    Years since quitting: 55.6   Smokeless tobacco: Never  Vaping Use   Vaping status: Never Used  Substance Use Topics   Alcohol use: Yes     Comment: rarely wine   Drug use: No     Colonoscopy: May 2018; Nandigam  PAP:  Bone density:   Allergies  Allergen Reactions   Ciprofloxacin Swelling and Rash   Flagyl [Metronidazole] Swelling   Cefdinir Diarrhea   Celecoxib Swelling   Diazepam Other (See Comments)    Dpression/Make patient restless  Make patient restless   Hydrocodone Other (See Comments)    Could not sleep   Hydrocodone-Acetaminophen  Other (See Comments)    restlessness   Oxycodone Other (See Comments)    Restlessness  Restless   Sulfa Antibiotics Rash   Sulfonamide Derivatives Rash    Current Outpatient Medications  Medication Sig Dispense Refill   Calcium Carbonate-Vitamin D  600-5 MG-MCG CAPS Take 600 mg by mouth daily.     glucose blood test strip 1 each by Other route as needed for other. Use as instructed     lisinopril (ZESTRIL) 40 MG tablet Take 40 mg by mouth daily.     metFORMIN (GLUCOPHAGE) 500 MG tablet Take 500 mg by  mouth daily after supper.       MYRBETRIQ 50 MG TB24 tablet Take 50 mg by mouth daily.     ONE TOUCH LANCETS MISC by Does not apply route.     rosuvastatin (CRESTOR) 5 MG tablet Take 1 tablet (5 mg total) by mouth daily.     vitamin B-12 (CYANOCOBALAMIN) 1000 MCG tablet Take 1,000 mcg by mouth daily.     cholecalciferol (VITAMIN D ) 1000 UNITS tablet Take 1,000 Units by mouth daily.   (Patient not taking: Reported on 03/15/2024)     Current Facility-Administered Medications  Medication Dose Route Frequency Provider Last Rate Last Admin   0.9 %  sodium chloride  infusion  500 mL Intravenous Once Nandigam, Kavitha V, MD        OBJECTIVE: White woman in no acute distress  Vitals:   03/15/24 1001  BP: (!) 168/66  Pulse: 64  Resp: 14  Temp: 98.4 F (36.9 C)  SpO2: 98%        Body mass index is 33.6 kg/m.   Wt Readings from Last 3 Encounters:  03/15/24 177 lb 12.8 oz (80.6 kg)  10/01/23 172 lb (78 kg)  04/13/23 177 lb (80.3 kg)     ECOG FS:1 - Symptomatic but  completely ambulatory   Physical exam not done, telephone visit   LAB RESULTS:  CMP     Component Value Date/Time   NA 134 (L) 04/01/2023 1412   NA 139 03/17/2017 1524   K 4.1 04/01/2023 1412   K 4.2 03/17/2017 1524   CL 103 04/01/2023 1412   CO2 24 04/01/2023 1412   CO2 23 03/17/2017 1524   GLUCOSE 100 (H) 04/01/2023 1412   GLUCOSE 102 03/17/2017 1524   BUN 20 04/01/2023 1412   BUN 28.6 (H) 03/17/2017 1524   CREATININE 1.01 04/01/2023 1412   CREATININE 0.9 03/17/2017 1524   CALCIUM 9.4 04/01/2023 1412   CALCIUM 9.8 03/17/2017 1524   PROT 7.0 03/01/2018 1026   PROT 6.8 03/17/2017 1524   ALBUMIN 3.6 03/01/2018 1026   ALBUMIN 3.8 03/17/2017 1524   AST 16 03/01/2018 1026   AST 15 03/17/2017 1524   ALT 17 03/01/2018 1026   ALT 16 03/17/2017 1524   ALKPHOS 90 03/01/2018 1026   ALKPHOS 88 03/17/2017 1524   BILITOT 0.4 03/01/2018 1026   BILITOT 0.31 03/17/2017 1524   GFRNONAA 54 (L) 03/01/2018 1026   GFRAA >60 03/01/2018 1026    Lab Results  Component Value Date   WBC 11.0 (H) 04/01/2023   NEUTROABS 7.0 04/01/2023   HGB 11.6 (L) 04/01/2023   HCT 35.2 (L) 04/01/2023   MCV 91.1 04/01/2023   PLT 159.0 04/01/2023      Chemistry      Component Value Date/Time   NA 134 (L) 04/01/2023 1412   NA 139 03/17/2017 1524   K 4.1 04/01/2023 1412   K 4.2 03/17/2017 1524   CL 103 04/01/2023 1412   CO2 24 04/01/2023 1412   CO2 23 03/17/2017 1524   BUN 20 04/01/2023 1412   BUN 28.6 (H) 03/17/2017 1524   CREATININE 1.01 04/01/2023 1412   CREATININE 0.9 03/17/2017 1524      Component Value Date/Time   CALCIUM 9.4 04/01/2023 1412   CALCIUM 9.8 03/17/2017 1524   ALKPHOS 90 03/01/2018 1026   ALKPHOS 88 03/17/2017 1524   AST 16 03/01/2018 1026   AST 15 03/17/2017 1524   ALT 17 03/01/2018 1026   ALT 16 03/17/2017 1524   BILITOT  0.4 03/01/2018 1026   BILITOT 0.31 03/17/2017 1524       No results found for: LABCA2  No components found for: [id number]  No results  for input(s): INR in the last 168 hours.  No results found for: LABCA2  No results found for: RJW800  No results found for: CAN125  No results found for: CAN153  No results found for: CA2729  No components found for: HGQUANT  No results found for: CEA1, CEA / No results found for: CEA1, CEA   No results found for: AFPTUMOR  No results found for: CHROMOGRNA  No results found for: TOTALPROTELP, ALBUMINELP, A1GS, A2GS, BETS, BETA2SER, GAMS, MSPIKE, SPEI (this displays SPEP labs)  No results found for: KPAFRELGTCHN, LAMBDASER, KAPLAMBRATIO (kappa/lambda light chains)  No results found for: HGBA, HGBA2QUANT, HGBFQUANT, HGBSQUAN (Hemoglobinopathy evaluation)   No results found for: LDH  No results found for: IRON, TIBC, IRONPCTSAT (Iron and TIBC)  No results found for: FERRITIN  Urinalysis No results found for: COLORURINE, APPEARANCEUR, LABSPEC, PHURINE, GLUCOSEU, HGBUR, BILIRUBINUR, KETONESUR, PROTEINUR, UROBILINOGEN, NITRITE, LEUKOCYTESUR   STUDIES: No results found.   ELIGIBLE FOR AVAILABLE RESEARCH PROTOCOL: no  ASSESSMENT: 78 y.o. [place], [place] woman status post right lumpectomy 02/20/2017 for 2 separate areas of invasive lobular carcinoma, grade 2, only one addressed in the pathology report, that one being pT1a pN0, stage IA, estrogen and progesterone receptor strongly positive, HER-2 not amplified  (a) the second area of known invasive lobular carcinoma was also included in the surgical sample; its dimensions and margins were to be addressed in a pathology addendum  (1) Oncotype not indicated and no chemotherapy is warranted  (2) adjuvant radiation to the right breast completed 05/25/2017  (3) exemestane  started 07/27/2017, switched to exemestane  April 2019  (a) bone density at [facility] 11/27/2015 shows a T score of +0.4  (b) repeat bone density 04/26/2018 shows a T  score of -1.0   (4) per patient, she carries a heterozygous MutYH mutation   PLAN:    Total encounter time 30 minutes.*  *Total Encounter Time as defined by the Centers for Medicare and Medicaid Services includes, in addition to the face-to-face time of a patient visit (documented in the note above) non-face-to-face time: obtaining and reviewing outside history, ordering and reviewing medications, tests or procedures, care coordination (communications with other health care professionals or caregivers) and documentation in the medical record.

## 2025-03-14 ENCOUNTER — Ambulatory Visit: Admitting: Hematology and Oncology
